# Patient Record
Sex: Female | Born: 1965 | Race: Black or African American | Hispanic: No | Marital: Married | Smoking: Current some day smoker
Health system: Southern US, Community
[De-identification: ages and names within clinical notes are randomized; demographics above are authoritative.]

## PROBLEM LIST (undated history)

## (undated) DIAGNOSIS — F419 Anxiety disorder, unspecified: Secondary | ICD-10-CM

## (undated) DIAGNOSIS — M810 Age-related osteoporosis without current pathological fracture: Secondary | ICD-10-CM

## (undated) DIAGNOSIS — M1990 Unspecified osteoarthritis, unspecified site: Secondary | ICD-10-CM

## (undated) DIAGNOSIS — I10 Essential (primary) hypertension: Secondary | ICD-10-CM

## (undated) DIAGNOSIS — F32A Depression, unspecified: Secondary | ICD-10-CM

## (undated) DIAGNOSIS — E119 Type 2 diabetes mellitus without complications: Secondary | ICD-10-CM

## (undated) DIAGNOSIS — T7840XA Allergy, unspecified, initial encounter: Secondary | ICD-10-CM

## (undated) DIAGNOSIS — G709 Myoneural disorder, unspecified: Secondary | ICD-10-CM

## (undated) DIAGNOSIS — G4730 Sleep apnea, unspecified: Secondary | ICD-10-CM

## (undated) DIAGNOSIS — M8580 Other specified disorders of bone density and structure, unspecified site: Secondary | ICD-10-CM

## (undated) HISTORY — DX: Unspecified osteoarthritis, unspecified site: M19.90

## (undated) HISTORY — DX: Depression, unspecified: F32.A

## (undated) HISTORY — DX: Essential (primary) hypertension: I10

## (undated) HISTORY — DX: Anxiety disorder, unspecified: F41.9

## (undated) HISTORY — DX: Allergy, unspecified, initial encounter: T78.40XA

## (undated) HISTORY — DX: Other specified disorders of bone density and structure, unspecified site: M85.80

## (undated) HISTORY — DX: Type 2 diabetes mellitus without complications: E11.9

## (undated) HISTORY — DX: Age-related osteoporosis without current pathological fracture: M81.0

## (undated) HISTORY — DX: Sleep apnea, unspecified: G47.30

## (undated) HISTORY — DX: Myoneural disorder, unspecified: G70.9

---

## 1997-08-21 ENCOUNTER — Ambulatory Visit (HOSPITAL_COMMUNITY): Admission: RE | Admit: 1997-08-21 | Discharge: 1997-08-21 | Payer: Self-pay | Admitting: Obstetrics and Gynecology

## 1997-09-04 ENCOUNTER — Inpatient Hospital Stay (HOSPITAL_COMMUNITY): Admission: AD | Admit: 1997-09-04 | Discharge: 1997-09-04 | Payer: Self-pay | Admitting: Obstetrics and Gynecology

## 1997-09-05 ENCOUNTER — Inpatient Hospital Stay (HOSPITAL_COMMUNITY): Admission: AD | Admit: 1997-09-05 | Discharge: 1997-09-05 | Payer: Self-pay | Admitting: Obstetrics and Gynecology

## 1997-11-30 ENCOUNTER — Inpatient Hospital Stay: Admission: AD | Admit: 1997-11-30 | Discharge: 1997-11-30 | Payer: Self-pay | Admitting: Obstetrics and Gynecology

## 1997-12-08 ENCOUNTER — Inpatient Hospital Stay (HOSPITAL_COMMUNITY): Admission: AD | Admit: 1997-12-08 | Discharge: 1997-12-10 | Payer: Self-pay | Admitting: Obstetrics and Gynecology

## 1998-01-13 ENCOUNTER — Encounter (HOSPITAL_COMMUNITY): Admission: RE | Admit: 1998-01-13 | Discharge: 1998-04-13 | Payer: Self-pay | Admitting: Obstetrics and Gynecology

## 1999-02-17 ENCOUNTER — Other Ambulatory Visit: Admission: RE | Admit: 1999-02-17 | Discharge: 1999-02-17 | Payer: Self-pay | Admitting: Obstetrics and Gynecology

## 1999-10-15 ENCOUNTER — Encounter: Payer: Self-pay | Admitting: Family Medicine

## 1999-10-15 ENCOUNTER — Ambulatory Visit (HOSPITAL_COMMUNITY): Admission: RE | Admit: 1999-10-15 | Discharge: 1999-10-15 | Payer: Self-pay | Admitting: Family Medicine

## 1999-11-11 ENCOUNTER — Ambulatory Visit (HOSPITAL_COMMUNITY): Admission: RE | Admit: 1999-11-11 | Discharge: 1999-11-11 | Payer: Self-pay | Admitting: Neurosurgery

## 1999-11-11 ENCOUNTER — Encounter: Payer: Self-pay | Admitting: Neurosurgery

## 2000-01-04 ENCOUNTER — Encounter: Payer: Self-pay | Admitting: Neurosurgery

## 2000-01-04 ENCOUNTER — Ambulatory Visit (HOSPITAL_COMMUNITY): Admission: RE | Admit: 2000-01-04 | Discharge: 2000-01-04 | Payer: Self-pay | Admitting: Neurosurgery

## 2000-03-24 ENCOUNTER — Other Ambulatory Visit: Admission: RE | Admit: 2000-03-24 | Discharge: 2000-03-24 | Payer: Self-pay | Admitting: Obstetrics and Gynecology

## 2002-05-10 ENCOUNTER — Encounter: Payer: Self-pay | Admitting: Family Medicine

## 2002-05-10 ENCOUNTER — Ambulatory Visit (HOSPITAL_COMMUNITY): Admission: RE | Admit: 2002-05-10 | Discharge: 2002-05-10 | Payer: Self-pay | Admitting: Family Medicine

## 2002-05-28 ENCOUNTER — Encounter: Admission: RE | Admit: 2002-05-28 | Discharge: 2002-05-28 | Payer: Self-pay

## 2003-06-05 ENCOUNTER — Other Ambulatory Visit: Admission: RE | Admit: 2003-06-05 | Discharge: 2003-06-05 | Payer: Self-pay | Admitting: Obstetrics and Gynecology

## 2005-03-15 ENCOUNTER — Emergency Department (HOSPITAL_COMMUNITY): Admission: EM | Admit: 2005-03-15 | Discharge: 2005-03-15 | Payer: Self-pay | Admitting: *Deleted

## 2006-09-14 ENCOUNTER — Encounter: Admission: RE | Admit: 2006-09-14 | Discharge: 2006-09-14 | Payer: Self-pay | Admitting: Obstetrics and Gynecology

## 2006-09-22 ENCOUNTER — Encounter: Admission: RE | Admit: 2006-09-22 | Discharge: 2006-09-22 | Payer: Self-pay | Admitting: Obstetrics and Gynecology

## 2007-10-24 ENCOUNTER — Encounter: Admission: RE | Admit: 2007-10-24 | Discharge: 2007-10-24 | Payer: Self-pay | Admitting: Obstetrics and Gynecology

## 2007-11-01 ENCOUNTER — Encounter: Admission: RE | Admit: 2007-11-01 | Discharge: 2007-11-01 | Payer: Self-pay | Admitting: Obstetrics and Gynecology

## 2008-11-12 ENCOUNTER — Encounter: Admission: RE | Admit: 2008-11-12 | Discharge: 2008-11-12 | Payer: Self-pay | Admitting: Obstetrics and Gynecology

## 2009-11-13 ENCOUNTER — Encounter: Admission: RE | Admit: 2009-11-13 | Discharge: 2009-11-13 | Payer: Self-pay | Admitting: Obstetrics and Gynecology

## 2010-02-14 ENCOUNTER — Encounter: Payer: Self-pay | Admitting: Obstetrics and Gynecology

## 2010-02-14 ENCOUNTER — Encounter: Payer: Self-pay | Admitting: Family Medicine

## 2010-09-10 ENCOUNTER — Ambulatory Visit (HOSPITAL_COMMUNITY)
Admission: RE | Admit: 2010-09-10 | Discharge: 2010-09-10 | Disposition: A | Discharge status: Home / Self Care | Payer: BC Managed Care – PPO | Source: Ambulatory Visit | Attending: Orthopedic Surgery | Admitting: Orthopedic Surgery

## 2010-09-10 ENCOUNTER — Other Ambulatory Visit (HOSPITAL_COMMUNITY): Payer: Self-pay | Admitting: Orthopedic Surgery

## 2010-09-10 ENCOUNTER — Encounter (HOSPITAL_COMMUNITY)
Admission: RE | Admit: 2010-09-10 | Discharge: 2010-09-10 | Disposition: A | Discharge status: Home / Self Care | Payer: BC Managed Care – PPO | Source: Ambulatory Visit | Attending: Orthopedic Surgery | Admitting: Orthopedic Surgery

## 2010-09-10 DIAGNOSIS — Z01811 Encounter for preprocedural respiratory examination: Secondary | ICD-10-CM

## 2010-09-10 DIAGNOSIS — Z01812 Encounter for preprocedural laboratory examination: Secondary | ICD-10-CM | POA: Insufficient documentation

## 2010-09-10 DIAGNOSIS — Z01818 Encounter for other preprocedural examination: Secondary | ICD-10-CM | POA: Insufficient documentation

## 2010-09-10 LAB — COMPREHENSIVE METABOLIC PANEL
AST: 24 U/L (ref 0–37)
Albumin: 4.1 g/dL (ref 3.5–5.2)
Calcium: 9.9 mg/dL (ref 8.4–10.5)
Creatinine, Ser: 0.81 mg/dL (ref 0.50–1.10)
GFR calc Af Amer: >60 mL/min (ref >60)
Glucose, Bld: 99 mg/dL (ref 70–99)
Sodium: 138 mEq/L (ref 135–145)
Total Bilirubin: 0.3 mg/dL (ref 0.3–1.2)
Total Protein: 7.1 g/dL (ref 6.0–8.3)

## 2010-09-10 LAB — URINALYSIS, ROUTINE W REFLEX MICROSCOPIC
Bilirubin Urine: NEGATIVE
Glucose, UA: NEGATIVE mg/dL
Ketones, ur: NEGATIVE mg/dL
Protein, ur: NEGATIVE mg/dL
Specific Gravity, Urine: 1.022 (ref 1.005–1.030)
Urobilinogen, UA: 0.2 mg/dL (ref 0.0–1.0)
pH: 5 (ref 5.0–8.0)

## 2010-09-10 LAB — DIFFERENTIAL
Eosinophils Absolute: 0.1 10*3/uL (ref 0.0–0.7)
Lymphs Abs: 3.7 10*3/uL (ref 0.7–4.0)
Monocytes Relative: 8 % (ref 3–12)

## 2010-09-10 LAB — CBC
HCT: 46.2 % — ABNORMAL HIGH (ref 36.0–46.0)
MCHC: 34.2 g/dL (ref 30.0–36.0)
RBC: 5.32 MIL/uL — ABNORMAL HIGH (ref 3.87–5.11)

## 2010-09-10 LAB — TYPE AND SCREEN: ABO/RH(D): O POS

## 2010-09-10 LAB — PROTIME-INR
INR: 0.95 (ref 0.00–1.49)
Prothrombin Time: 12.9 seconds (ref 11.6–15.2)

## 2010-09-10 LAB — SURGICAL PCR SCREEN: Staphylococcus aureus: NEGATIVE

## 2010-09-10 LAB — APTT: aPTT: 34 seconds (ref 24–37)

## 2010-09-16 ENCOUNTER — Observation Stay (HOSPITAL_COMMUNITY)
Admission: RE | Admit: 2010-09-16 | Discharge: 2010-09-17 | DRG: 864 | Disposition: A | Discharge status: Home / Self Care | Payer: BC Managed Care – PPO | Source: Ambulatory Visit | Attending: Orthopedic Surgery | Admitting: Orthopedic Surgery

## 2010-09-16 ENCOUNTER — Ambulatory Visit (HOSPITAL_COMMUNITY): Payer: BC Managed Care – PPO

## 2010-09-16 DIAGNOSIS — Z01818 Encounter for other preprocedural examination: Secondary | ICD-10-CM | POA: Insufficient documentation

## 2010-09-16 DIAGNOSIS — M5000 Cervical disc disorder with myelopathy, unspecified cervical region: Principal | ICD-10-CM | POA: Insufficient documentation

## 2010-09-16 DIAGNOSIS — F17200 Nicotine dependence, unspecified, uncomplicated: Secondary | ICD-10-CM | POA: Insufficient documentation

## 2010-09-16 DIAGNOSIS — Y998 Other external cause status: Secondary | ICD-10-CM | POA: Insufficient documentation

## 2010-09-16 DIAGNOSIS — J449 Chronic obstructive pulmonary disease, unspecified: Secondary | ICD-10-CM | POA: Insufficient documentation

## 2010-09-16 DIAGNOSIS — J4489 Other specified chronic obstructive pulmonary disease: Secondary | ICD-10-CM | POA: Insufficient documentation

## 2010-09-16 DIAGNOSIS — Z01812 Encounter for preprocedural laboratory examination: Secondary | ICD-10-CM | POA: Insufficient documentation

## 2010-09-16 DIAGNOSIS — Z01810 Encounter for preprocedural cardiovascular examination: Secondary | ICD-10-CM | POA: Insufficient documentation

## 2010-09-16 DIAGNOSIS — S01502A Unspecified open wound of oral cavity, initial encounter: Secondary | ICD-10-CM | POA: Insufficient documentation

## 2010-09-16 DIAGNOSIS — W503XXA Accidental bite by another person, initial encounter: Secondary | ICD-10-CM | POA: Insufficient documentation

## 2010-09-16 HISTORY — PX: SPINAL CORD DECOMPRESSION: SHX97

## 2010-09-16 HISTORY — PX: SPINAL FUSION: SHX223

## 2010-09-22 NOTE — Consult Note (Signed)
  [name], CELLUCCI NO.:  [id number]  MEDICAL RECORD NO.:  [id number]  LOCATION:  3536                         FACILITY:  [facility]  PHYSICIAN:  Newman Pies, MD            DATE OF BIRTH:  [birth year]  DATE OF CONSULTATION:  09/16/2010 DATE OF DISCHARGE:                                CONSULTATION   CHIEF COMPLAINT:  Dorsal tongue laceration.  HISTORY OF PRESENT ILLNESS:  The patient is a pleasant 45 year old female who underwent anterior cervical spine decompression and fusion procedure by Dr. Yevette Edwards earlier today.  Upon waking up from her procedure, the patient accidentally bite down on her tongue causing a significant laceration of the dorsal tongue.  A significant amount of bleeding was noted initially.  However, the bleeding has spontaneously resolved.  ENT is consulted for further evaluation and treatment.  PAST MEDICAL HISTORY: 1. History of right arm myelopathy. 2. 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 PAST SURGICAL HISTORY:  As above.  HOME MEDICATION:  Multivitamin, Vicodin, meloxicam, cyclobenzaprine.  ALLERGIES:  PENICILLIN.  SOCIAL HISTORY:  The patient smokes 1-2 pack of cigarettes per week. She denies any significant alcohol or illegal drug abuse.  PHYSICAL EXAMINATION:  GENERAL:  The patient is a well-nourished and well-developed 45 year old female in no acute distress.  She is alert and oriented x3. HEENT:  Her pupils are equal, round, and reactive to light.  Extraocular motion is intact.  Examination of the ears shows normal auricles and external auditory canals.  Nasal examination shows normal mucosa, septum, and turbinates.  Oral cavity examination shows a large 2-cm left dorsal tongue laceration.  The laceration is not through-and-through. There is a good amount of soft tissue support on the lateral aspect as well as the ventral aspect of the tongue.  No acute bleeding is noted at the time of examination.  The rest of the oral cavity,  mucosa, and pharynx are all normal.  The patient is in cervical collar.  No stridor is noted.  Her voice is strong.  Cranial nerves II through XII are intact.  Tongue movement is normal.  IMPRESSION:  Accidental tongue lacerations upon emergence from her general anesthesia.  RECOMMENDATIONS:  Since the laceration is not through-and-through, and the patient has good tongue tissue to support the lateral aspect as well as the ventral aspect, no suturing is needed.  The patient will likely heal without any long-term sequelae.  The patient is instructed to call my office with any questions or concerns.  She may reach my office at [phone].     Newman Pies, MD     ST/MEDQ  D:  09/16/2010  T:  09/17/2010  Job:  [id number]  Electronically Signed by Newman Pies MD on 09/22/2010 12:22:07 PM

## 2010-09-28 NOTE — Discharge Summary (Signed)
  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  ADMITTING PHYSICIAN:  Estill Bamberg, MD  ADMISSION HISTORY:  Briefly, [name] is an extremely pleasant 45 year old female who was evaluated by me and diagnosed with myelopathy in addition to radiculopathy.  The patient was admitted on September 16, 2010, for a three-level anterior cervical decompression and fusion.  HOSPITAL COURSE:  On September 16, 2010, the patient was admitted to the hospital, underwent a three-level ACDF as noted above.  The patient tolerated the procedure well and was transferred to recovery in stable condition.  Of note, was identified by the Anesthesia Team that the patient sustained a tongue laceration involving the anterior and left- sided aspect of her tongue.  This did go on cause her some discomfort. I did consult an ENT specialist.  She was evaluated by ENT on the evening of her surgery at 6:00 p.m.Marland Kitchen  The ENT specialist did not feel that any specific treatment was needed and that this would heal uneventfully on its own.  The patient's tongue pain did decrease throughout her hospital stay.  She was evaluated by me, in the morning of postoperative day #1 was noted to be neurovascular intact and her dressing was noted be clean, dry, and intact.  We therefore discharged her home uneventfully.  DISCHARGE INSTRUCTIONS:  The patient will wear her [place] collar at all times.  She was given a [place] collar to be used while  showering. She was given Percocet for pain and Valium for spasms.  She will follow back up with me in approximately 2 weeks after her procedure.     Estill Bamberg, MD     MD/MEDQ  D:  09/17/2010  T:  09/17/2010  Job:  [id number]  Electronically Signed by Estill Bamberg  on 09/28/2010 06:46:10 PM

## 2010-09-28 NOTE — Op Note (Signed)
[name], [name] NO.:  [id number]  MEDICAL RECORD NO.:  [id number]  LOCATION:  SDSC                         FACILITY:  [facility]  PHYSICIAN:  Estill Bamberg, MD      DATE OF BIRTH:  [birth year]  DATE OF PROCEDURE:  09/16/2010 DATE OF DISCHARGE:                              OPERATIVE REPORT   PREOPERATIVE DIAGNOSIS:  Right-sided cervical myeloradiculopathy.  POSTOPERATIVE DIAGNOSIS:  Right-sided cervical myeloradiculopathy.  PROCEDURE: 1. C4-5, C5-6, C6-7 anterior cervical decompression and fusion. 2. Anterior instrumentation C4-C7 (Synthes Vectra plate with 16 mm     self-drilling, self-tapping screws). 3. Insertion of interbody device x3 (a 6-mm Titan interbody cage). 4. Use of local autograft. 5. Intraoperative use of fluoroscopy.  SURGEON:  Estill Bamberg, MD  ASSISTANT:  Janace Litten, OPA  ANESTHESIA:  General endotracheal anesthesia.  COMPLICATIONS:  None.  DISPOSITION:  Stable.  ESTIMATED BLOOD LOSS:  Minimal.  INDICATIONS FOR PROCEDURE:  Briefly, [name] is an extremely pleasant 45 year old female who initially presented to me on August 09, 2010, with complaints related to both her neck and her low back as well as her right arm and her right leg.  The patient was noted to have significant degeneration of her cervical spine.  She also had history and physical exam findings consistent with cervical myelopathy.  I therefore did go forward obtaining an MRI which is clearly notable for varying degrees of neural foraminal stenosis in addition to spinal cord compression as well as advanced in severe degenerative disk disease at the C4-5, C5-6 and C6- 7 levels.  After her failure of conservative care, we did discuss going ahead with a C4-C7 anterior cervical decompression and fusion.  Of note, the patient clearly understood the risks and limitations of the procedure as outlined in my preoperative note.  OPERATIVE DETAILS:  On September 16, 2010, the  patient was brought to Surgery and general endotracheal anesthesia was administered.  The neck was placed in a gentle degree of extension and the shoulders were taped to the inferior aspect of the bed.  Of note, neurologic monitoring was used throughout the procedure to ensure that there was no occult spinal cord injury.  There was no change in the baseline motor evoked potentials throughout the surgery.  SCDs were placed.  1 gram of vancomycin was given.  Bite blocks were placed by the anesthesia team in the standard fashion and did appear to be placed adequately.  I did at this point  bring in lateral fluoroscopy in order to help optimize the location of my incision.  The neck was then prepped and draped in the usual sterile fashion.  I then made an incision from the midline to the medial border of the sternocleidomastoid muscle overlying the C5-6 interspace.  This was made in line with a skin crease.  The platysma was identified and taken down.  The plane between the sternocleidomastoid muscle laterally and the strap muscles medially was identified and explored and the anterior cervical spine was readily identified.  Of note, there were extensive and rather prominent osteophytes noted across the C4-5, C5-6 and C6-7 levels.  I did place an 18-gauge needle into the C5-6 interspace to help  confirm the appropriate levels.  I then removed the rather significant osteophytes at the C4-5, C5-6 and C6-7 levels.  The osteophytes were placed in the back table for later use.  I then subperiosteally exposed the vertebral bodies of C4, C5, C6 and C7 out to the uncovertebral joints bilaterally.  At this point, a self-retaining shadow line retractor was placed and centered over the C6-7 interspace.  I then used a 15-blade knife to perform an anterior annulotomy.  I used a series of Kerrisons and curettes in order to perform a preliminary diskectomy.  Caspar pins were placed into the C6 and C7  vertebral bodies and gentle distraction was applied.  The posterior longitudinal ligament was encountered and taken down using a #1 Kerrison.  I then meticulously used a #1 Kerrison to remove the disk osteophyte complex more towards the patient's left side, the area where there was rather significant spinal cord compression noted on her preoperative MRI.  I did go forward with a thorough neural foraminal decompression on both the right and the left sides at the C6-7 level.  I then gently prepare the endplates using a rasp.  I then placed a series of trials and I did feel that a parallel 6-mm Titan cage would be the most appropriate fit.  The Titan cage was packed with the autograft obtained when the bone spurs were removed in addition to DBX obtained from the [facility] [facility] [facility].  The interbody graft was tamped into position in the usual fashion.  I did note an excellent press fit.  I then turned my attention towards the C5-6 interspace.  A diskectomy was performed in the manner described previously.  Again, a thorough decompression was performed posteriorly out to the right and left neural foramina.  I again felt that a 6-mm graft would be the most appropriate fit.  A lordotic graft was selected and packed with DBX in addition to autograft.  This again was tamped into position in the usual fashion.  This was done under distraction using Caspar pins.  I then turned my attention towards the C4-5 interspace.  Again, a diskectomy was performed as previously described.  A 6-mm lordotic graft was tamped into position in the usual fashion.  I was very happy with the final press fit of each of the interbody grafts.  I did use intermittent AP and lateral fluoroscopy in order to confirm optimize location of the interbody grafts.  I then selected a Synthes Vectra plate of the appropriate length and this was placed over the anterior aspect of the cervical spine.  I then placed a  total of eight 16 mm self-drilling, self-tapping screws, two into C4, two into C5, two into C6 and two into C7.  I did note excellent purchase of each of the screws.  I again used the AP and lateral fluoroscopy while placing the screws in order to help optimize the trajectory of the screws.  The left-sided C5 screw had a slightly inferior trajectory and I did attempt to repositioned the screw, but was unsuccessful in attempting me to create more cephalad trajectory.  However, I did note an excellent press fit.  I was very happy with the final AP and lateral fluoroscopic radiographs.  I then copiously irrigated the wound.  The platysma and subcutaneous layers were closed using 2-0 Vicryl.  The skin was closed using 3-0 Monocryl. Steri-Strips and sterile dressing was applied followed by a hard cervical collar.  The patient was then awoken from general endotracheal  anesthesia and transferred to recovery in stable condition.  All instrument counts were correct at the termination of the procedure.  Of note, Janace Litten was my assistant throughout the procedure and aided in essential retraction and suctioning needed throughout the surgery.  Also of note, the  patient was noted to have a tongue laceration at the  anterior and left aspect of the tongue upon completion of the surgery and after removal of the patient's bite blocks.  I did discuss this with the  anesthesia team and there was no readily available explanation as to why this  did occur.  I did explain this finding to the patient's family and will evaluate  the patient later this evening and consult an ENT specialist if warrented.  I will also continue to investigate why this may have occured as it did appear  the bite blocks were appropriately positioned.     Estill Bamberg, MD     MD/MEDQ  D:  09/16/2010  T:  09/16/2010  Job:  [id number]  cc:   Izell [place]. Elesa Hacker, M.D.  Electronically Signed by Estill Bamberg  on 09/28/2010  06:56:43 PM

## 2011-01-05 ENCOUNTER — Ambulatory Visit (INDEPENDENT_AMBULATORY_CARE_PROVIDER_SITE_OTHER): Payer: BC Managed Care – PPO

## 2011-01-05 DIAGNOSIS — J209 Acute bronchitis, unspecified: Secondary | ICD-10-CM

## 2011-01-05 DIAGNOSIS — R05 Cough: Secondary | ICD-10-CM

## 2011-01-05 DIAGNOSIS — R059 Cough, unspecified: Secondary | ICD-10-CM

## 2011-06-27 ENCOUNTER — Ambulatory Visit (INDEPENDENT_AMBULATORY_CARE_PROVIDER_SITE_OTHER): Payer: BC Managed Care – PPO | Admitting: Family Medicine

## 2011-06-27 VITALS — BP 119/80 | HR 68 | Temp 98.4°F | Resp 16 | Ht 65.0 in | Wt 189.0 lb

## 2011-06-27 DIAGNOSIS — M792 Neuralgia and neuritis, unspecified: Secondary | ICD-10-CM

## 2011-06-27 DIAGNOSIS — M62838 Other muscle spasm: Secondary | ICD-10-CM

## 2011-06-27 DIAGNOSIS — IMO0002 Reserved for concepts with insufficient information to code with codable children: Secondary | ICD-10-CM

## 2011-06-27 MED ORDER — HYDROCODONE-ACETAMINOPHEN 5-500 MG PO TABS: 1.0000 | ORAL_TABLET | Freq: Two times a day (BID) | ORAL | Status: AC | PRN

## 2011-06-27 MED ORDER — CYCLOBENZAPRINE HCL 5 MG PO TABS: 5.0000 mg | ORAL_TABLET | Freq: Three times a day (TID) | ORAL | Status: DC | PRN

## 2011-06-27 MED ORDER — CYCLOBENZAPRINE HCL 5 MG PO TABS: 5.0000 mg | ORAL_TABLET | Freq: Three times a day (TID) | ORAL | Status: AC | PRN

## 2011-06-27 NOTE — Patient Instructions (Signed)
Patient information: Muscle strain (The Basics)View in SpanishWritten by the doctors and Visual merchandiser at [employer]  What is a muscle strain? -- A muscle strain can happen when a muscle gets stretched too much or too quickly, or works too hard. This sometimes makes the muscle tear. Another term for a muscle strain is a "pulled muscle." A muscle strain can happen during an accident or exercise. Muscles that are commonly strained include those in the back, neck, and back of the leg. What are the symptoms of a muscle strain? -- Symptoms happen in the area of the muscle strain and can include: Pain  Muscle spasm or tightness  Swelling  Bruising  Weakness or being unable to move the muscle Will I need tests? -- Probably not. Your doctor or nurse should be able to tell if you have a muscle strain by learning about your symptoms and doing an exam.  Some people need tests. Depending on your symptoms, your doctor or nurse might order an imaging test such as an ultrasound or MRI scan. Imaging tests create pictures of the inside of the body. How is a muscle strain treated? -- A muscle strain usually gets better on its own, but it can take days to weeks to heal completely.  To help your symptoms get better, you can: Rest your muscle and avoid movements or activities that cause pain  Ice the area - You can put a cold gel pack, bag of ice, or bag of frozen vegetables on the painful muscle every 1 to 2 hours, for 15 minutes each time. Put a thin towel between the ice (or other cold object) and your skin. Use the ice (or other cold object) for at least 6 hours after the injury. Some people find it helpful to ice up to 2 days after an injury.  Wrap your muscle with an elastic bandage, other type of wrap, or fabric "sleeve" (Show Thigh sleeve PI) - This helps support your muscle.  Raise the muscle above the level of your heart (if possible) - For example, you can prop your leg up on pillows. This is helpful only for the  first few days after an injury.  Take medicine to reduce the pain and swelling - If you have a lot of pain or a severe muscle strain, your doctor will prescribe a strong pain medicine. If your strain is not severe, you can take an over-the-counter medicine such as acetaminophen (sample brand name: Tylenol), ibuprofen (sample brand names: Advil, Motrin), or naproxen (sample brand names: Aleve, Naprosyn). After your pain gets better, your doctor or nurse will recommend that you gently stretch and exercise your muscle. Stretches and exercises can help strengthen your muscles and keep them from getting too stiff.  Your doctor or nurse will show you stretches and exercises to do. Or he or she will have you work with a physical therapist (exercise expert). It's important to let your muscle heal before you play sports or do other activities that use the muscle again. If you don't let your muscle heal, you are likely to injure it again. Can a muscle strain be prevented? -- You can help prevent a muscle strain by taking time to warm up your muscles before you exercise. You can do this by walking or doing another light activity. If you are not sure how to warm up before exercising, ask your doctor, nurse, or physical therapist. More on this topic

## 2011-06-27 NOTE — Progress Notes (Signed)
  Subjective:    Patient ID: [name], female    DOB: 11-Jan-1966, 46 y.o.   MRN: [id number]  HPI Neck and arm spasms x 1 week.  Prior hx/o cervical laminectomy with [facility] 08/2010 via Dr. Marissa Nestle  Has had steroid shots for this in the past.  Has had some recurrent upper and lower R extremity paresthesias and radicular pain.  No weakness or giving away/fallls  Works at [employer] and has noticed worsening pain with lateral movement of neck.  Some radicular pain down R arm, though predominant issue has been neck and shoulder pain.  Had episode where pt had to stop working because of neck and shoulder pain/tightness.     Review of Systems See HPI, otherwise ROS negative     Objective:   Physical Exam  Musculoskeletal:       Arms:  Gen: up in chair, NAD HEENT: NCAT, EOMI,  CV: RRR, no murmurs auscultated PULM: CTAB, no wheezes, rales, rhoncii ABD: S/NT/+ bowel sounds  EXT: 2+ peripheral pulses Neuro: CN II-XII grossly intact, no focal paresthesias, reflexes symmetric bilaterally, neurovascularly intact diffusely    Assessment & Plan:  Will treat for muscle spasm with vicodin and flexeril.  Likely neuropathic component given recurrent radicular sxs.  Will refer back to Dr. Marissa Nestle. May consider starting on neurontin for long term symptomatic.

## 2011-08-23 ENCOUNTER — Other Ambulatory Visit: Payer: Self-pay | Admitting: Obstetrics and Gynecology

## 2011-08-23 DIAGNOSIS — Z1231 Encounter for screening mammogram for malignant neoplasm of breast: Secondary | ICD-10-CM

## 2011-08-31 ENCOUNTER — Ambulatory Visit
Admission: RE | Admit: 2011-08-31 | Discharge: 2011-08-31 | Disposition: A | Discharge status: Home / Self Care | Payer: BC Managed Care – PPO | Source: Ambulatory Visit | Attending: Obstetrics and Gynecology | Admitting: Obstetrics and Gynecology

## 2011-08-31 DIAGNOSIS — Z1231 Encounter for screening mammogram for malignant neoplasm of breast: Secondary | ICD-10-CM

## 2012-02-27 ENCOUNTER — Ambulatory Visit (INDEPENDENT_AMBULATORY_CARE_PROVIDER_SITE_OTHER): Payer: BC Managed Care – PPO | Admitting: Emergency Medicine

## 2012-02-27 ENCOUNTER — Ambulatory Visit: Payer: BC Managed Care – PPO

## 2012-02-27 ENCOUNTER — Telehealth: Payer: Self-pay | Admitting: Radiology

## 2012-02-27 VITALS — BP 118/78 | HR 71 | Temp 97.9°F | Resp 16 | Ht 65.5 in | Wt 187.4 lb

## 2012-02-27 DIAGNOSIS — R55 Syncope and collapse: Secondary | ICD-10-CM

## 2012-02-27 DIAGNOSIS — Z981 Arthrodesis status: Secondary | ICD-10-CM

## 2012-02-27 DIAGNOSIS — F17200 Nicotine dependence, unspecified, uncomplicated: Secondary | ICD-10-CM

## 2012-02-27 LAB — POCT CBC
HCT, POC: 48.9 % — AB (ref 37.7–47.9)
Lymph, poc: 3.3 (ref 0.6–3.4)
MCHC: 31.9 g/dL (ref 31.8–35.4)
MCV: 91.8 fL (ref 80–97)
POC Granulocyte: 4.3 (ref 2–6.9)
POC LYMPH PERCENT: 40.2 %L (ref 10–50)
RDW, POC: 14.8 %

## 2012-02-27 LAB — COMPREHENSIVE METABOLIC PANEL
CO2: 27 mEq/L (ref 19–32)
Creat: 0.96 mg/dL (ref 0.50–1.10)
Glucose, Bld: 91 mg/dL (ref 70–99)
Total Bilirubin: 0.5 mg/dL (ref 0.3–1.2)

## 2012-02-27 LAB — TSH: TSH: 1.199 u[IU]/mL (ref 0.350–4.500)

## 2012-02-27 LAB — T4, FREE: Free T4: 1.17 ng/dL (ref 0.80–1.80)

## 2012-02-27 NOTE — Progress Notes (Signed)
  Subjective:    Patient ID: [name], female    DOB: 12/07/1965, 47 y.o.   MRN: [id number]  HPI  Patient presents with concern after passing out on Saturday evening while out of town. Prior to passing out she saw blinking lights, she went outside to get some air. Pt was sitting in a chair and rocking. When she went to get up with the assistance of her husband because she was feeling very weak. She fell limp unable to walk. Once she was in the house her family put a cold rag on her neck, she then passed out and complained that she was unable to see. Husband states that she was shaking on her upper half, she did not have any strength or motion in her lower limbs. She did not lose control of her bowels during that time. She reports that she has never had anything happen to her like this before. This episode lasted about 15 minutes. She has no chest pain palpitations or shortness of breath   Pt has a history of smoking. A pack might last her 2-3 days. She does not drink alcohol on a regular basis. Pt had a spinal fusion last year.   Pt comes here for care when needed. Does not report a PCP.   Review of Systems     Objective:   Physical Exam HEENT exam reveals a irregular nodule in the sub mental area. The tongue itself looks normal. There is no other adenopathy in the neck normal carotids are clear. Chest clear to auscultation and percussion heart regular rate no murmurs neurological exam 2 through 12 is intact. Disc margins are sharp. Deep tendon reflexes are symmetrical. Motor strength is 5 out of 5.  [facility] reading (PRIMARY) by  Dr.Clarrisa Kaylor no acute changes. Status post neck fusion. 1       Assessment & Plan:  Patient here with a syncopal episode on Saturday night very suspicious for seizure. The event was preceded by blinking lights she saw in both eyes. This was followed by loss of consciousness and some shaking of the upper extremities. Routine blood work will be done. EKG done. We'll  schedule an MRI with contrast of the brain neurological referral for possible seizure disorder

## 2012-02-27 NOTE — Telephone Encounter (Signed)
Referrals FYI: Please make the MRI brain with contrast appointment by Wednesday.  Thanks:)

## 2012-02-28 ENCOUNTER — Other Ambulatory Visit: Payer: Self-pay | Admitting: Radiology

## 2012-02-28 DIAGNOSIS — R55 Syncope and collapse: Secondary | ICD-10-CM

## 2012-02-29 ENCOUNTER — Other Ambulatory Visit: Payer: Self-pay | Admitting: Otolaryngology

## 2012-02-29 DIAGNOSIS — R221 Localized swelling, mass and lump, neck: Secondary | ICD-10-CM

## 2012-03-02 ENCOUNTER — Ambulatory Visit
Admission: RE | Admit: 2012-03-02 | Discharge: 2012-03-02 | Disposition: A | Discharge status: Home / Self Care | Payer: BC Managed Care – PPO | Source: Ambulatory Visit | Attending: Otolaryngology | Admitting: Otolaryngology

## 2012-03-02 DIAGNOSIS — R221 Localized swelling, mass and lump, neck: Secondary | ICD-10-CM

## 2012-03-03 ENCOUNTER — Ambulatory Visit
Admission: RE | Admit: 2012-03-03 | Discharge: 2012-03-03 | Disposition: A | Discharge status: Home / Self Care | Payer: BC Managed Care – PPO | Source: Ambulatory Visit | Attending: Emergency Medicine | Admitting: Emergency Medicine

## 2012-03-03 DIAGNOSIS — R55 Syncope and collapse: Secondary | ICD-10-CM

## 2012-03-03 MED ORDER — GADOBENATE DIMEGLUMINE 529 MG/ML IV SOLN
17.0000 mL | Freq: Once | INTRAVENOUS | Status: AC | PRN
  Administered 2012-03-03: 17 mL via INTRAVENOUS

## 2012-03-05 ENCOUNTER — Other Ambulatory Visit: Payer: Self-pay | Admitting: Emergency Medicine

## 2012-03-05 DIAGNOSIS — R55 Syncope and collapse: Secondary | ICD-10-CM

## 2012-03-07 ENCOUNTER — Telehealth: Payer: Self-pay

## 2012-03-07 NOTE — Telephone Encounter (Signed)
Pt said that someone called her but her husband answered and didn't tell her who called but would like for someone to call her back

## 2012-03-07 NOTE — Telephone Encounter (Signed)
I spoke to patient to advise on the Scan she did see Dr Terrace Arabia at [facility]rology.  She said Dr Terrace Arabia gave her meds for depression and sleep. She does not feel she needs these medications and does not plan to take these. I did advise her the medication for depression could also be used to treat neurologic conditions, she should research this further before she decides not to take this. She did not know the name of the medication. She states she will call Dr Terrace Arabia to get clarification regarding her medications. I did tell her the MRI was negative, but she may require further testing, and Dr Terrace Arabia is the one who would do this. Amy

## 2012-08-07 ENCOUNTER — Other Ambulatory Visit: Payer: Self-pay

## 2012-08-07 DIAGNOSIS — Z1231 Encounter for screening mammogram for malignant neoplasm of breast: Secondary | ICD-10-CM

## 2012-08-31 ENCOUNTER — Ambulatory Visit: Payer: BC Managed Care – PPO

## 2012-09-11 ENCOUNTER — Ambulatory Visit
Admission: RE | Admit: 2012-09-11 | Discharge: 2012-09-11 | Disposition: A | Discharge status: Home / Self Care | Payer: BC Managed Care – PPO | Source: Ambulatory Visit

## 2012-09-11 DIAGNOSIS — Z1231 Encounter for screening mammogram for malignant neoplasm of breast: Secondary | ICD-10-CM

## 2013-06-07 ENCOUNTER — Ambulatory Visit (INDEPENDENT_AMBULATORY_CARE_PROVIDER_SITE_OTHER): Payer: BC Managed Care – PPO | Admitting: Internal Medicine

## 2013-06-07 VITALS — BP 120/80 | HR 64 | Temp 98.3°F | Resp 16 | Ht 64.5 in | Wt 193.0 lb

## 2013-06-07 DIAGNOSIS — R599 Enlarged lymph nodes, unspecified: Secondary | ICD-10-CM

## 2013-06-07 MED ORDER — CEPHALEXIN 500 MG PO CAPS: 500.0000 mg | ORAL_CAPSULE | Freq: Three times a day (TID) | ORAL | Status: DC

## 2013-06-07 NOTE — Progress Notes (Signed)
   Subjective:    Patient ID: [name], female    DOB: 22-Jan-1966, 48 y.o.   MRN: [id number] This chart was scribed for Ellamae Siaobert Kinser Fellman, MD by Danella Maiersaroline Early, ED Scribe. This patient was seen in room 5 and the patient's care was started at 5:14 PM.  Chief Complaint  Patient presents with  . Adenopathy    X Wednesday    HPI HPI Comments: [name] is a 48 y.o. female [facility] gradually-worsening left-sided neck pain with associated swelling onset 2 days ago. She has tried home remedies with no relief. She states neck pain worsens with swallowing. She denies throat pain or fevers. She denies pain with biting down.   PCP - LITTLE, ALFRED B, MD  Patient Active Problem List   Diagnosis Date Noted  . S/P cervical spinal fusion 02/27/2012   No current outpatient prescriptions on file prior to visit.   No current facility-administered medications on file prior to visit.     Review of Systems  Constitutional: Negative for fever and chills.  HENT: Negative for sore throat.   Gastrointestinal: Negative for vomiting.  Musculoskeletal: Positive for neck pain.  Skin: Negative for rash.       Objective:   Physical Exam  Nursing note and vitals reviewed. Constitutional: She is oriented to person, place, and time. She appears well-developed and well-nourished. No distress.  HENT:  Head: Normocephalic and atraumatic.  Nose: Nose normal.  Mouth/Throat: Oropharynx is clear and moist. No oropharyngeal exudate.  Throat is fairly clear. Teeth are fine.   Eyes: EOM are normal.  Neck: Normal range of motion. Neck supple. No thyromegaly present.  Tender cervical adenopathy bilaterally.   Cardiovascular: Normal rate.   Pulmonary/Chest: Effort normal. No respiratory distress.  Musculoskeletal: Normal range of motion.  Neurological: She is alert and oriented to person, place, and time.  Skin: Skin is warm and dry.  Psychiatric: She has a normal mood  and affect. Her behavior is normal.    Filed Vitals:   06/07/13 1643  BP: 120/80  Pulse: 64  Temp: 98.3 F (36.8 C)  TempSrc: Oral  Resp: 16  Height: 5' 4.5" (1.638 m)  Weight: 193 lb (87.544 kg)  SpO2: 98%         Assessment & Plan:    I have completed the patient encounter in its entirety as documented by the scribe, with editing by me where necessary. Emile Ringgenberg P. Merla Richesoolittle, M.D. Enlargement of lymph nodes  Meds ordered this encounter  Medications  . cephALEXin (KEFLEX) 500 MG capsule    Sig: Take 1 capsule (500 mg total) by mouth 3 (three) times daily.    Dispense:  30 capsule    Refill:  0   Reck 10d

## 2013-07-18 ENCOUNTER — Ambulatory Visit (INDEPENDENT_AMBULATORY_CARE_PROVIDER_SITE_OTHER): Payer: BC Managed Care – PPO | Admitting: Family Medicine

## 2013-07-18 ENCOUNTER — Encounter: Payer: Self-pay | Admitting: Family Medicine

## 2013-07-18 VITALS — BP 111/78 | HR 85 | Temp 98.4°F | Resp 18 | Ht 65.0 in | Wt 190.0 lb

## 2013-07-18 DIAGNOSIS — F17200 Nicotine dependence, unspecified, uncomplicated: Secondary | ICD-10-CM

## 2013-07-18 DIAGNOSIS — Z0000 Encounter for general adult medical examination without abnormal findings: Secondary | ICD-10-CM

## 2013-07-18 DIAGNOSIS — J302 Other seasonal allergic rhinitis: Secondary | ICD-10-CM

## 2013-07-18 DIAGNOSIS — J309 Allergic rhinitis, unspecified: Secondary | ICD-10-CM

## 2013-07-18 DIAGNOSIS — E669 Obesity, unspecified: Secondary | ICD-10-CM

## 2013-07-18 DIAGNOSIS — Z720 Tobacco use: Secondary | ICD-10-CM | POA: Insufficient documentation

## 2013-07-18 DIAGNOSIS — Z1159 Encounter for screening for other viral diseases: Secondary | ICD-10-CM

## 2013-07-18 DIAGNOSIS — Z13220 Encounter for screening for lipoid disorders: Secondary | ICD-10-CM

## 2013-07-18 MED ORDER — BUPROPION HCL ER (XL) 150 MG PO TB24: 150.0000 mg | ORAL_TABLET | Freq: Every day | ORAL | Status: DC

## 2013-07-18 NOTE — Patient Instructions (Addendum)
Keeping You Healthy  Get These Tests 1. Blood Pressure- Have your blood pressure checked once a year by your health care provider.  Normal blood pressure is 120/80. 2. Weight- Have your body mass index (BMI) calculated to screen for obesity.  BMI is measure of body fat based on height and weight.  You can also calculate your own BMI at https://www.west-esparza.com/www.nhlbisupport.com/bmi/. 3. Cholesterol- Have your cholesterol checked every 5 years starting at age 49 then yearly starting at age 49. 4. Chlamydia, HIV, and other sexually transmitted diseases- Get screened every year until age 49, then within three months of each new sexual provider. 5. Pap Smear- Every 1-3 years; discuss with your health care provider. 6. Mammogram- Every year starting at age 49  Take these medicines  Calcium with Vitamin D-Your body needs 1200 mg of Calcium each day and [phone] IU of Vitamin D daily.  Your body can only absorb 500 mg of Calcium at a time so Calcium must be taken in 2 or 3 divided doses throughout the day.  Multivitamin with folic acid- Once daily if it is possible for you to become pregnant.  Get these Immunizations  Gardasil-Series of three doses; prevents HPV related illness such as genital warts and cervical cancer.  Menactra-Single dose; prevents meningitis.  Tetanus shot- Every 10 years.  Flu shot-Every year.  Take these steps 1. Do not smoke-Your healthcare provider can help you quit.  For tips on how to quit go to www.smokefree.gov or call 1-800 QUITNOW. 2. Be physically active- Exercise 5 days a week for at least 30 minutes.  If you are not already physically active, start slow and gradually work up to 30 minutes of moderate physical activity.  Examples of moderate activity include walking briskly, dancing, swimming, bicycling, etc. 3. Breast Cancer- A self breast exam every month is important for early detection of breast cancer.  For more information and instruction on self breast exams, ask your  healthcare provider or [place].eswww.womenshealth.gov/faq/breast-self-exam.cfm. 4. Eat a healthy diet- Eat a variety of healthy foods such as fruits, vegetables, whole grains, low fat milk, low fat cheeses, yogurt, lean meats, poultry and fish, beans, nuts, tofu, etc.  For more information go to www. Thenutritionsource.org 5. Drink alcohol in moderation- Limit alcohol intake to one drink or less per day. Never drink and drive. 6. Depression- Your emotional health is as important as your physical health.  If you're feeling down or losing interest in things you normally enjoy please talk to your healthcare provider about being screened for depression. 7. Dental visit- Brush and floss your teeth twice daily; visit your dentist twice a year. 8. Eye doctor- Get an eye exam at least every 2 years. 9. Helmet use- Always wear a helmet when riding a bicycle, motorcycle, rollerblading or skateboarding. 10. Safe sex- If you may be exposed to sexually transmitted infections, use a condom. 11. Seat belts- Seat belts can save your live; always wear one. 12. Smoke/Carbon Monoxide detectors- These detectors need to be installed on the appropriate level of your home. Replace batteries at least once a year. 13. Skin cancer- When out in the sun please cover up and use sunscreen 15 SPF or higher. 14. Violence- If anyone is threatening or hurting you, please tell your healthcare provider.   Smoking Cessation, Tips for Success If you are ready to quit smoking, congratulations! You have chosen to help yourself be healthier. Cigarettes bring nicotine, tar, carbon monoxide, and other irritants into your body. Your lungs, heart, and blood  vessels will be able to work better without these poisons. There are many different ways to quit smoking. Nicotine gum, nicotine patches, a nicotine inhaler, or nicotine nasal spray can help with physical craving. Hypnosis, support groups, and medicines help break the habit of smoking. WHAT THINGS CAN  I DO TO MAKE QUITTING EASIER?  Here are some tips to help you quit for good:  Pick a date when you will quit smoking completely. Tell all of your friends and family about your plan to quit on that date.  Do not try to slowly cut down on the number of cigarettes you are smoking. Pick a quit date and quit smoking completely starting on that day.  Throw away all cigarettes.   Clean and remove all ashtrays from your home, work, and car.   On a card, write down your reasons for quitting. Carry the card with you and read it when you get the urge to smoke.   Cleanse your body of nicotine. Drink enough water and fluids to keep your urine clear or pale yellow. Do this after quitting to flush the nicotine from your body.   Learn to predict your moods. Do not let a bad situation be your excuse to have a cigarette. Some situations in your life might tempt you into wanting a cigarette.   Never have "just one" cigarette. It leads to wanting another and another. Remind yourself of your decision to quit.   Change habits associated with smoking. If you smoked while driving or when feeling stressed, try other activities to replace smoking. Stand up when drinking your coffee. Brush your teeth after eating. Sit in a different chair when you read the paper. Avoid alcohol while trying to quit, and try to drink fewer caffeinated beverages. Alcohol and caffeine may urge you to smoke.   Avoid foods and drinks that can trigger a desire to smoke, such as sugary or spicy foods and alcohol.   Ask people who smoke not to smoke around you.   Have something planned to do right after eating or having a cup of coffee. For example, plan to take a walk or exercise.   Try a relaxation exercise to calm you down and decrease your stress. Remember, you may be tense and nervous for the first 2 weeks after you quit, but this will pass.   Find new activities to keep your hands busy. Play with a pen, coin, or rubber  band. Doodle or draw things on paper.   Brush your teeth right after eating. This will help cut down on the craving for the taste of tobacco after meals. You can also try mouthwash.   Use oral substitutes in place of cigarettes. Try using lemon drops, carrots, cinnamon sticks, or chewing gum. Keep them handy so they are available when you have the urge to smoke.   When you have the urge to smoke, try deep breathing.   Designate your home as a nonsmoking area.   If you are a heavy smoker, ask your health care provider about a prescription for nicotine chewing gum. It can ease your withdrawal from nicotine.   Reward yourself. Set aside the cigarette money you save and buy yourself something nice.   Look for support from others. Join a support group or smoking cessation program. Ask someone at home or at work to help you with your plan to quit smoking.   Always ask yourself, "Do I need this cigarette or is this just a reflex?" Tell yourself, "  Today, I choose not to smoke," or "I do not want to smoke." You are reminding yourself of your decision to quit.  Do not replace cigarette smoking with electronic cigarettes (commonly called e-cigarettes). The safety of e-cigarettes is unknown, and some may contain harmful chemicals.  If you relapse, do not give up! Plan ahead and think about what you will do the next time you get the urge to smoke.  HOW WILL I FEEL WHEN I QUIT SMOKING? You may have symptoms of withdrawal because your body is used to nicotine (the addictive substance in cigarettes). You may crave cigarettes, be irritable, feel very hungry, cough often, get headaches, or have difficulty concentrating. The withdrawal symptoms are only temporary. They are strongest when you first quit but will go away within 10-14 days. When withdrawal symptoms occur, stay in control. Think about your reasons for quitting. Remind yourself that these are signs that your body is healing and getting used  to being without cigarettes. Remember that withdrawal symptoms are easier to treat than the major diseases that smoking can cause.  Even after the withdrawal is over, expect periodic urges to smoke. However, these cravings are generally short lived and will go away whether you smoke or not. Do not smoke!  WHAT RESOURCES ARE AVAILABLE TO HELP ME QUIT SMOKING? Your health care provider can direct you to community resources or hospitals for support, which may include:  Group support.  Education.  Hypnosis.  Therapy. Document Released: 10/09/2003 Document Revised: 10/31/2012 Document Reviewed: 06/28/2012 [facility] Patient Information 2015 [place] [place]. This information is not intended to replace advice given to you by your health care provider. Make sure you discuss any questions you have with your health care provider.    You can come in anytime in last July or early August to get fasting labs drawn. Come to the [address]. You need to be fasting for 10-12 hours to get accurate values for lipids (cholesterol and triglyceride values).

## 2013-07-20 ENCOUNTER — Encounter: Payer: Self-pay | Admitting: Family Medicine

## 2013-07-20 NOTE — Progress Notes (Signed)
Subjective:    Patient ID: [name], female    DOB: 04-29-1965, 48 y.o.   MRN: [id number]  HPI This 48 y.o. AA female is here for CPE (first visit w/ this PCP). Pt is in good health but is struggling w/ weight loss and is a smoker. She would like to quit but is concerned about weight gain. She states she smokes < 1/2 ppd ( does not smoke at work). Acknowledges smoking associated w/ anxiety and would consider medication but has heard about side effects, especially w/ Chantix ("bad dreams" and strange behavior). Also has seasonal allergies; symptoms worsened by tobacco use.  HCM: PAP- per GYN (Dr. Billy Coastaavon).           MMG- Current (due in July).           IMM- Current.           CRS- Not yet and no family hx.  PMHx, Surg Hx, Soc and Fam Hx reviewed.  S/P C4-5, C5-6, C6-7 anterior decompression and fusion (Dr. Yevette Edwardsumonski- 09/16/2010).   Review of Systems  Constitutional: Negative.   HENT: Positive for congestion, postnasal drip and sneezing. Negative for dental problem, hearing loss, sinus pressure, sore throat, trouble swallowing and voice change.   Eyes: Negative.   Respiratory: Negative.   Cardiovascular: Negative.   Gastrointestinal: Negative.   Endocrine: Negative.   Genitourinary: Negative.   Musculoskeletal: Negative.   Skin: Negative.   Allergic/Immunologic: Negative.   Neurological: Negative.   Hematological: Negative.   Psychiatric/Behavioral: Negative.       Objective:   Physical Exam  Nursing note and vitals reviewed. Constitutional: She is oriented to person, place, and time. Vital signs are normal. She appears well-developed and well-nourished. No distress.  HENT:  Head: Normocephalic and atraumatic.  Right Ear: Hearing, tympanic membrane, external ear and ear canal normal.  Left Ear: Hearing, tympanic membrane, external ear and ear canal normal.  Nose: Mucosal edema present. No rhinorrhea, nasal deformity or septal deviation. Right sinus exhibits no maxillary  sinus tenderness and no frontal sinus tenderness. Left sinus exhibits no maxillary sinus tenderness and no frontal sinus tenderness.  Mouth/Throat: Uvula is midline and mucous membranes are normal. No oral lesions. Normal dentition. No dental caries. Posterior oropharyngeal erythema present. No oropharyngeal exudate or posterior oropharyngeal edema.  Eyes: EOM and lids are normal. Pupils are equal, round, and reactive to light. Right conjunctiva is injected. Left conjunctiva is injected. No scleral icterus.  Fundoscopic exam:      The right eye shows no arteriolar narrowing, no AV nicking and no papilledema. The right eye shows red reflex.       The left eye shows no arteriolar narrowing, no AV nicking and no papilledema. The left eye shows red reflex.  Neck: Full passive range of motion without pain. Neck supple. No JVD present. No spinous process tenderness and no muscular tenderness present. Decreased range of motion present. No mass and no thyromegaly present.  Cardiovascular: Normal rate, regular rhythm, S1 normal, S2 normal and normal heart sounds.   No extrasystoles are present. PMI is not displaced.  Exam reveals no gallop and no friction rub.   No murmur heard. Pulmonary/Chest: Effort normal and breath sounds normal. No respiratory distress.  Abdominal: Soft. Normal appearance, normal aorta and bowel sounds are normal. She exhibits no distension and no mass. There is no hepatosplenomegaly. There is no tenderness. There is no guarding and no CVA tenderness.  Genitourinary:  Deferred.  Musculoskeletal:  Cervical back: She exhibits decreased range of motion and spasm. She exhibits no tenderness, no bony tenderness, no edema, no deformity and no pain.       Thoracic back: Normal.       Lumbar back: Normal.  Remainder of exam unremarkable.  Lymphadenopathy:       Head (right side): No submental, no submandibular, no tonsillar, no preauricular, no posterior auricular and no occipital  adenopathy present.       Head (left side): No submental, no submandibular, no tonsillar, no preauricular, no posterior auricular and no occipital adenopathy present.    She has no cervical adenopathy.       Right: No inguinal and no supraclavicular adenopathy present.       Left: No inguinal and no supraclavicular adenopathy present.  Neurological: She is alert and oriented to person, place, and time. She has normal strength. She displays no atrophy and no tremor. No cranial nerve deficit or sensory deficit. She exhibits normal muscle tone. She displays a negative Romberg sign. Coordination and gait normal. She displays no Babinski's sign on the right side. She displays no Babinski's sign on the left side.  Reflex Scores:      Tricep reflexes are 1+ on the right side and 1+ on the left side.      Bicep reflexes are 2+ on the right side and 2+ on the left side.      Brachioradialis reflexes are 2+ on the right side and 2+ on the left side.      Patellar reflexes are 2+ on the right side and 2+ on the left side. Skin: Skin is warm, dry and intact. No ecchymosis, no lesion and no rash noted. She is not diaphoretic. No cyanosis or erythema. Nails show no clubbing.  Psychiatric: She has a normal mood and affect. Her speech is normal and behavior is normal. Judgment and thought content normal. Cognition and memory are normal.       Assessment & Plan:  Routine general medical examination at a health care facility  Tobacco user- Encouraged cessation; pt agrees trial of Wellbutrin and given "Tips for Success" print-out.  Obesity, unspecified - Given information about one-on-one counseling and coaching w/ a local professional.  Plan: Comprehensive metabolic panel, Vitamin D, 25-hydroxy (future labs).  Seasonal allergies- Continue OTC anti-histamine.  Need for hepatitis C screening test  Screening for hyperlipidemia - Plan: Lipid panel (future labs)  Meds ordered this encounter  Medications  .  buPROPion (WELLBUTRIN XL) 150 MG 24 hr tablet    Sig: Take 1 tablet (150 mg total) by mouth daily.    Dispense:  30 tablet    Refill:  2

## 2013-09-19 ENCOUNTER — Ambulatory Visit: Payer: BC Managed Care – PPO | Admitting: Family Medicine

## 2013-10-15 ENCOUNTER — Other Ambulatory Visit: Payer: Self-pay

## 2013-10-15 DIAGNOSIS — Z1231 Encounter for screening mammogram for malignant neoplasm of breast: Secondary | ICD-10-CM

## 2013-11-01 ENCOUNTER — Ambulatory Visit
Admission: RE | Admit: 2013-11-01 | Discharge: 2013-11-01 | Disposition: A | Discharge status: Home / Self Care | Payer: BC Managed Care – PPO | Source: Ambulatory Visit

## 2013-11-01 DIAGNOSIS — Z1231 Encounter for screening mammogram for malignant neoplasm of breast: Secondary | ICD-10-CM

## 2014-01-01 ENCOUNTER — Ambulatory Visit (INDEPENDENT_AMBULATORY_CARE_PROVIDER_SITE_OTHER): Payer: BC Managed Care – PPO | Admitting: Family Medicine

## 2014-01-01 VITALS — BP 131/83 | HR 69 | Temp 98.3°F | Resp 16 | Ht 65.0 in | Wt 190.4 lb

## 2014-01-01 DIAGNOSIS — D72829 Elevated white blood cell count, unspecified: Secondary | ICD-10-CM

## 2014-01-01 DIAGNOSIS — M25569 Pain in unspecified knee: Secondary | ICD-10-CM

## 2014-01-01 DIAGNOSIS — R202 Paresthesia of skin: Secondary | ICD-10-CM

## 2014-01-01 DIAGNOSIS — Z131 Encounter for screening for diabetes mellitus: Secondary | ICD-10-CM

## 2014-01-01 DIAGNOSIS — F17200 Nicotine dependence, unspecified, uncomplicated: Secondary | ICD-10-CM

## 2014-01-01 LAB — POCT CBC
Granulocyte percent: 54.8 %G (ref 37–80)
HCT, POC: 47.3 % (ref 37.7–47.9)
Hemoglobin: 15.1 g/dL (ref 12.2–16.2)
Lymph, poc: 4.6 — AB (ref 0.6–3.4)
MCH, POC: 29.6 pg (ref 27–31.2)
MCHC: 32 g/dL (ref 31.8–35.4)
MCV: 92.5 fL (ref 80–97)
MID (cbc): 0.5 (ref 0–0.9)
MPV: 8.2 fL (ref 0–99.8)
POC Granulocyte: 6.1 (ref 2–6.9)
POC LYMPH PERCENT: 41 % (ref 10–50)
POC MID %: 4.2 % (ref 0–12)
Platelet Count, POC: 232 10*3/uL (ref 142–424)
RBC: 5.11 M/uL (ref 4.04–5.48)
RDW, POC: 16.1 %
WBC: 11.2 10*3/uL — AB (ref 4.6–10.2)

## 2014-01-01 LAB — POCT GLYCOSYLATED HEMOGLOBIN (HGB A1C): Hemoglobin A1C: 5.6

## 2014-01-01 MED ORDER — CYCLOBENZAPRINE HCL 5 MG PO TABS: ORAL_TABLET | ORAL | Status: DC

## 2014-01-01 NOTE — Progress Notes (Signed)
 Chief Complaint:  Chief Complaint  Patient presents with  . Leg Pain    calf    HPI: [name] is a 48 y.o. female who is here foracute onset of  Bilateral calf pain yesterday, she states it is sensitive to the touch, she feels pain in the calf and dull numbness when she touches them. She was walking around the [employer] and also the [employer] and noticed the onset of sxs, legs , she states they  feel different, tight and numb and is just hard to describe. Barely rubbing her hand over her legs causes her to have  a painful tingle from calves to ankles. She denies any risk factors for DVT/PE, she is along time smoker, she denies more pain with walking and less pain with rest. She denies any HA, vision changes, n/v/abd pain, Cp, SOB, palpitations, assymetrric weakness, asymmetric swelling, drooping of the face.She does feel sluggish but was not able to sleep last night. She went into work today. She is here because she did not want to go to the ER. Sheis afraid she may have blood clots after reading on the internet   She denies any recent flu vaccine, densi any numbness in hands, denies DM  She thinks she has arthritis, she has had a 4 level AFCD with Dr Lynann Bologna in the past with minimal sxs relief   Wt Readings from Last 3 Encounters:  01/01/14 190 lb 6.4 oz (86.365 kg)  07/18/13 190 lb (86.183 kg)  06/07/13 193 lb (87.544 kg)   BP Readings from Last 3 Encounters:  01/01/14 131/83  07/18/13 111/78  06/07/13 120/80     Past Medical History  Diagnosis Date  . Allergy    Past Surgical History  Procedure Laterality Date  . Spinal cord decompression N/A 09/16/2010    Cervical spine- Dr. Lynann Bologna  . Spinal fusion N/A 09/16/2010    Cervical spine- Dr. Lynann Bologna   History   Social History  . Marital Status: Single    Spouse Name: N/A    Number of Children: N/A  . Years of Education: N/A   Social History Main Topics  . Smoking status: Current Every Day  Smoker    Types: Cigarettes  . Smokeless tobacco: Not on file  . Alcohol Use: Yes  . Drug Use: Not on file  . Sexual Activity: Yes   Other Topics Concern  . Not on file   Social History Narrative  . No narrative on file   No family history on file. Allergies  Allergen Reactions  . Claritin [Loratadine] Other (See Comments)    Discoloration of lips w/ swelling.  Marland Kitchen Penicillins    Prior to Admission medications   Not on File     ROS: The patient denies fevers, chills, night sweats, unintentional weight loss, chest pain, palpitations, wheezing, dyspnea on exertion, nausea, vomiting, abdominal pain, dysuria, hematuria, melena, weakness, + numbness, , +tingling.  Denis incontinence. Denies doubles vision  All other systems have been reviewed and were otherwise negative with the exception of those mentioned in the HPI and as above.    PHYSICAL EXAM: Filed Vitals:   01/01/14 2003  BP: 131/83  Pulse: 69  Temp: 98.3 F (36.8 C)  Resp: 16   Filed Vitals:   01/01/14 2003  Height: _0  (1.651 m)  Weight: 190 lb 6.4 oz (86.365 kg)   Body mass index is 31.68 kg/(m^2).  General: Alert, minimal  Distress, she is anxious  HEENT:  Normocephalic, atraumatic, oropharynx patent. EOMI, PERRLA, fundo normal Cardiovascular:  Regular rate and rhythm, no rubs murmurs or gallops.  No Carotid bruits, radial pulse intact. No pedal edema.  Respiratory: Clear to auscultation bilaterally.  No wheezes, rales, or rhonchi.  No cyanosis, no use of accessory musculature GI: No organomegaly, abdomen is soft and non-tender, positive bowel sounds.  No masses. Skin: No rashes. Neurologic: Facial musculature symmetric. Cn 2-12 grossly normal, neg meningeal signs, she has nol senstation ot light touch and decrease sensation bilaterraly in  to sharp touch She has no assymm swelling, calves are slightly tender bilaterally, there is no erythema/no warmth. + DP  Psychiatric: Patient is appropriate throughout  our interaction. Lymphatic: No cervical lymphadenopathy Musculoskeletal: Gait intact.5/5 strength, 2/2 DTR Negative for saddle anesthesia    LABS: Results for orders placed or performed in visit on 01/01/14  POCT CBC  Result Value Ref Range   WBC 11.2 (A) 4.6 - 10.2 K/uL   Lymph, poc 4.6 (A) 0.6 - 3.4   POC LYMPH PERCENT 41.0 10 - 50 %L   MID (cbc) 0.5 0 - 0.9   POC MID % 4.2 0 - 12 %M   POC Granulocyte 6.1 2 - 6.9   Granulocyte percent 54.8 37 - 80 %G   RBC 5.11 4.04 - 5.48 M/uL   Hemoglobin 15.1 12.2 - 16.2 g/dL   HCT, POC 47.3 37.7 - 47.9 %   MCV 92.5 80 - 97 fL   MCH, POC 29.6 27 - 31.2 pg   MCHC 32.0 31.8 - 35.4 g/dL   RDW, POC 16.1 %   Platelet Count, POC 232 142 - 424 K/uL   MPV 8.2 0 - 99.8 fL  POCT glycosylated hemoglobin (Hb A1C)  Result Value Ref Range   Hemoglobin A1C 5.6      EKG/XRAY:   Primary read interpreted by Dr. Marin Comment at [facility].   ASSESSMENT/PLAN: Encounter Diagnoses  Name Primary?  . Paresthesia Yes  . Pain in joint, lower leg, unspecified laterality   . Tobacco dependence   . Leukocytosis    48 y/o female with a PMH of tobacco abuse who presents with bilateral calf pain and is worried about a blood clot.  She was a little bit put off by the fact that we did not have a doppler in the office, I offered that she go to the ER, she Declined to go to ER She will get a stat doppler and arterial studies to rule out her concerns for blood clot and also my concern for claudication in the AM Her leukocytosis is possibly related to early viral sxs, she feels achey all over. Labs pending: CMP, TSH, ESR Preacutions given to go to the ER Rx Flexeril  F/u prn  Gross sideeffects, risk and benefits, and alternatives of medications d/w patient. Patient is aware that all medications have potential sideeffects and we are unable to predict every sideeffect or drug-drug interaction that may occur.  , [place], DO 01/01/2014 9:05 PM

## 2014-01-02 ENCOUNTER — Ambulatory Visit (HOSPITAL_COMMUNITY)
Admission: RE | Admit: 2014-01-02 | Discharge: 2014-01-02 | Disposition: A | Discharge status: Home / Self Care | Payer: BC Managed Care – PPO | Source: Ambulatory Visit | Attending: Family Medicine | Admitting: Family Medicine

## 2014-01-02 ENCOUNTER — Telehealth: Payer: Self-pay | Admitting: Radiology

## 2014-01-02 ENCOUNTER — Encounter: Payer: Self-pay | Admitting: Family Medicine

## 2014-01-02 DIAGNOSIS — M25569 Pain in unspecified knee: Secondary | ICD-10-CM

## 2014-01-02 DIAGNOSIS — M79606 Pain in leg, unspecified: Secondary | ICD-10-CM | POA: Diagnosis not present

## 2014-01-02 DIAGNOSIS — R202 Paresthesia of skin: Secondary | ICD-10-CM

## 2014-01-02 DIAGNOSIS — R209 Unspecified disturbances of skin sensation: Secondary | ICD-10-CM | POA: Insufficient documentation

## 2014-01-02 DIAGNOSIS — F17200 Nicotine dependence, unspecified, uncomplicated: Secondary | ICD-10-CM | POA: Insufficient documentation

## 2014-01-02 LAB — COMPLETE METABOLIC PANEL WITHOUT GFR
ALT: 18 U/L (ref 0–35)
AST: 16 U/L (ref 0–37)
Alkaline Phosphatase: 81 U/L (ref 39–117)
BUN: 12 mg/dL (ref 6–23)
Creat: 0.96 mg/dL (ref 0.50–1.10)

## 2014-01-02 LAB — COMPLETE METABOLIC PANEL WITH GFR
Albumin: 4.2 g/dL (ref 3.5–5.2)
CO2: 27 mEq/L (ref 19–32)
Calcium: 9.6 mg/dL (ref 8.4–10.5)
Chloride: 103 mEq/L (ref 96–112)
GFR, Est African American: 80 mL/min
GFR, Est Non African American: 70 mL/min
Glucose, Bld: 99 mg/dL (ref 70–99)
Potassium: 4.4 mEq/L (ref 3.5–5.3)
Sodium: 138 mEq/L (ref 135–145)
Total Bilirubin: 0.3 mg/dL (ref 0.2–1.2)
Total Protein: 7.1 g/dL (ref 6.0–8.3)

## 2014-01-02 LAB — SEDIMENTATION RATE: Sed Rate: 1 mm/hr (ref 0–22)

## 2014-01-02 LAB — TSH: TSH: 2.959 u[IU]/mL (ref 0.350–4.500)

## 2014-01-02 NOTE — Telephone Encounter (Signed)
Received call report from [facility]: negative for DVT; Dr Conley RollsLe made aware, instructed to tell patient doppler results and lab results.    Informed patient of results and labs normal; awaiting sed rate results still.

## 2014-01-02 NOTE — Progress Notes (Addendum)
*  PRELIMINARY RESULTS* Vascular Ultrasound Lower extremity venous duplex has been completed.  Preliminary findings: No evidence of DVT or baker's cyst bilaterally.  Bilateral PTA and Pero A were evaluated and demonstrate normal arterial flow.   [place]ed results to [place]. Patient can leave and will be contacted with further instructions.   Farrel Demark.Amoni Scallan Eunice, RDMS, RVT  01/02/2014, 3:31 PM

## 2014-09-19 ENCOUNTER — Telehealth: Payer: Self-pay | Admitting: Family Medicine

## 2014-09-19 ENCOUNTER — Ambulatory Visit (INDEPENDENT_AMBULATORY_CARE_PROVIDER_SITE_OTHER): Payer: BC Managed Care – PPO | Admitting: Family Medicine

## 2014-09-19 VITALS — BP 126/78 | HR 78 | Temp 98.2°F | Resp 18 | Ht 64.0 in | Wt 188.0 lb

## 2014-09-19 DIAGNOSIS — J029 Acute pharyngitis, unspecified: Secondary | ICD-10-CM

## 2014-09-19 DIAGNOSIS — H9392 Unspecified disorder of left ear: Secondary | ICD-10-CM | POA: Diagnosis not present

## 2014-09-19 DIAGNOSIS — J019 Acute sinusitis, unspecified: Secondary | ICD-10-CM | POA: Diagnosis not present

## 2014-09-19 LAB — POCT RAPID STREP A (OFFICE): Rapid Strep A Screen: NEGATIVE

## 2014-09-19 MED ORDER — AZITHROMYCIN 250 MG PO TABS: ORAL_TABLET | ORAL | Status: DC

## 2014-09-19 NOTE — Telephone Encounter (Signed)
Patient called to let me know that her throat feels like it is closing , she took the azithromycin since 4 pm and went to bed, since taking ither throat feels like it has been swollen and glands are swollen She has PCN allergy and is allergic to Claritin but has taken Benadryl in the past without problems. I have asked her to take some Bendryl and go straight to the ER. She is able to swallow, talk. She will go to [facility] ER, I have notifed the ER

## 2014-09-19 NOTE — Progress Notes (Signed)
Chief Complaint:  Chief Complaint  Patient presents with  . Sore Throat    x1 week had gotten better but now back   . Nasal Congestion  . Facial Pain    HPI: [name] is a 49 y.o. female who reports to [facility] today complaining of 1 week hsiotry of sinus sxs, no cough sxs, she has had a lot of nasal congestion and eye irritation. No fevers. She has had chills and she was cold.  She tried apple cider vinegar. Sore throat started yesterday. She is not feeling well. She has PCN allergy. Her ears do not hurt. Denies  asthma. No recent travels. Her son is sick as well. Has allergies .   Past Medical History  Diagnosis Date  . Allergy    Past Surgical History  Procedure Laterality Date  . Spinal cord decompression N/A 09/16/2010    Cervical spine- Dr. Yevette Edwards  . Spinal fusion N/A 09/16/2010    Cervical spine- Dr. Yevette Edwards   Social History   Social History  . Marital Status: Single    Spouse Name: N/A  . Number of Children: N/A  . Years of Education: N/A   Social History Main Topics  . Smoking status: Current Every Day Smoker    Types: Cigarettes  . Smokeless tobacco: None  . Alcohol Use: Yes  . Drug Use: None  . Sexual Activity: Yes   Other Topics Concern  . None   Social History Narrative   History reviewed. No pertinent family history. Allergies  Allergen Reactions  . Claritin [Loratadine] Other (See Comments)    Discoloration of lips w/ swelling.  Marland Kitchen Penicillins    Prior to Admission medications   Medication Sig Start Date End Date Taking? Authorizing Provider  olopatadine (PATANOL) 0.1 % ophthalmic solution Place 1 drop into both eyes 2 (two) times daily.   Yes Historical Provider, MD  cyclobenzaprine (FLEXERIL) 5 MG tablet Take 1-2 tabs PO qhs prn Patient not taking: Reported on 09/19/2014 01/01/14   Dejaun Vidrio P Keyshla Tunison, DO     ROS: The patient denies fevers, night sweats, unintentional weight loss, chest pain, palpitations, wheezing, dyspnea on exertion,  nausea, vomiting, abdominal pain, dysuria, hematuria, melena, numbness, weakness, or tingling.   All other systems have been reviewed and were otherwise negative with the exception of those mentioned in the HPI and as above.    PHYSICAL EXAM: Filed Vitals:   09/19/14 1139  BP: 126/78  Pulse: 78  Temp: 98.2 F (36.8 C)  Resp: 18   Body mass index is 32.25 kg/(m^2).   General: Alert, no acute distress HEENT:  Normocephalic, atraumatic, oropharynx patent. EOMI, PERRLA + maxillary sinus tenderness, left TM abnormally erythematous, no exudates.  Cardiovascular:  Regular rate and rhythm, no rubs murmurs or gallops.   Respiratory: Clear to auscultation bilaterally.  No wheezes, rales, or rhonchi.  No cyanosis, no use of accessory musculature Abdominal: No organomegaly, abdomen is soft and non-tender, positive bowel sounds. No masses. Skin: No rashes. Neurologic: Facial musculature symmetric. Psychiatric: Patient acts appropriately throughout our interaction. Lymphatic: No cervical or submandibular lymphadenopathy Musculoskeletal: Gait intact. No edema, tenderness   LABS: Results for orders placed or performed in visit on 09/19/14  POCT rapid strep A  Result Value Ref Range   Rapid Strep A Screen Negative Negative     EKG/XRAY:   Primary read interpreted by Dr. Conley Rolls at [facility].   ASSESSMENT/PLAN: Encounter Diagnoses  Name Primary?  . Acute rhinosinusitis Yes  .  Acute pharyngitis, unspecified pharyngitis type   . Abnormal ear exam, left     Rx azithromycin Push fluids Fu prn   Gross sideeffects, risk and benefits, and alternatives of medications d/w patient. Patient is aware that all medications have potential sideeffects and we are unable to predict every sideeffect or drug-drug interaction that may occur.  Cassell Voorhies DO  09/19/2014 1:09 PM

## 2014-09-19 NOTE — Patient Instructions (Signed)

## 2014-09-20 ENCOUNTER — Telehealth: Payer: Self-pay | Admitting: Family Medicine
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
## 2014-09-21 LAB — CULTURE, GROUP A STREP: Organism ID, Bacteria: NORMAL

## 2014-09-22 ENCOUNTER — Telehealth: Payer: Self-pay | Admitting: Family Medicine

## 2014-09-22 NOTE — Telephone Encounter (Signed)
LM about her son's labs. Also inquired how she was doing.

## 2014-10-29 ENCOUNTER — Emergency Department (HOSPITAL_COMMUNITY)
Admission: EM | Admit: 2014-10-29 | Discharge: 2014-10-29 | Disposition: A | Discharge status: Home / Self Care | Payer: BC Managed Care – PPO | Attending: Emergency Medicine | Admitting: Emergency Medicine

## 2014-10-29 ENCOUNTER — Encounter (HOSPITAL_COMMUNITY): Payer: Self-pay | Admitting: *Deleted

## 2014-10-29 ENCOUNTER — Emergency Department (HOSPITAL_COMMUNITY): Payer: BC Managed Care – PPO

## 2014-10-29 DIAGNOSIS — Z3202 Encounter for pregnancy test, result negative: Secondary | ICD-10-CM | POA: Diagnosis not present

## 2014-10-29 DIAGNOSIS — Z880 Allergy status to penicillin: Secondary | ICD-10-CM | POA: Diagnosis not present

## 2014-10-29 DIAGNOSIS — Z720 Tobacco use: Secondary | ICD-10-CM | POA: Diagnosis not present

## 2014-10-29 DIAGNOSIS — R5383 Other fatigue: Secondary | ICD-10-CM | POA: Diagnosis present

## 2014-10-29 DIAGNOSIS — Z79899 Other long term (current) drug therapy: Secondary | ICD-10-CM | POA: Diagnosis not present

## 2014-10-29 DIAGNOSIS — B349 Viral infection, unspecified: Secondary | ICD-10-CM | POA: Diagnosis not present

## 2014-10-29 DIAGNOSIS — E860 Dehydration: Secondary | ICD-10-CM | POA: Diagnosis not present

## 2014-10-29 LAB — CBC WITH DIFFERENTIAL/PLATELET
BASOS ABS: 0 10*3/uL (ref 0.0–0.1)
BASOS PCT: 1 %
Eosinophils Absolute: 0 10*3/uL (ref 0.0–0.7)
Eosinophils Relative: 1 %
HEMATOCRIT: 45 % (ref 36.0–46.0)
HEMOGLOBIN: 15.6 g/dL — AB (ref 12.0–15.0)
Lymphocytes Relative: 26 %
Lymphs Abs: 1.1 10*3/uL (ref 0.7–4.0)
MCH: 30.2 pg (ref 26.0–34.0)
MCHC: 34.7 g/dL (ref 30.0–36.0)
MCV: 87 fL (ref 78.0–100.0)
MONOS PCT: 22 %
Monocytes Absolute: 0.9 10*3/uL (ref 0.1–1.0)
NEUTROS ABS: 2 10*3/uL (ref 1.7–7.7)
NEUTROS PCT: 50 %
Platelets: 187 10*3/uL (ref 150–400)
RBC: 5.17 MIL/uL — ABNORMAL HIGH (ref 3.87–5.11)
RDW: 14.6 % (ref 11.5–15.5)
WBC: 4.1 10*3/uL (ref 4.0–10.5)

## 2014-10-29 LAB — URINALYSIS, ROUTINE W REFLEX MICROSCOPIC
BILIRUBIN URINE: NEGATIVE
GLUCOSE, UA: NEGATIVE mg/dL
Hgb urine dipstick: NEGATIVE
KETONES UR: 15 mg/dL — AB
Leukocytes, UA: NEGATIVE
Nitrite: NEGATIVE
PH: 6 (ref 5.0–8.0)
Protein, ur: NEGATIVE mg/dL
Specific Gravity, Urine: 1.019 (ref 1.005–1.030)
Urobilinogen, UA: 0.2 mg/dL (ref 0.0–1.0)

## 2014-10-29 LAB — COMPREHENSIVE METABOLIC PANEL
ALBUMIN: 3.5 g/dL (ref 3.5–5.0)
ALK PHOS: 55 U/L (ref 38–126)
ALT: 23 U/L (ref 14–54)
AST: 24 U/L (ref 15–41)
Anion gap: 9 (ref 5–15)
BILIRUBIN TOTAL: 0.7 mg/dL (ref 0.3–1.2)
BUN: 6 mg/dL (ref 6–20)
CALCIUM: 8.6 mg/dL — AB (ref 8.9–10.3)
CO2: 22 mmol/L (ref 22–32)
Chloride: 101 mmol/L (ref 101–111)
Creatinine, Ser: 0.83 mg/dL (ref 0.44–1.00)
GFR calc Af Amer: >60 mL/min (ref >60)
GFR calc non Af Amer: >60 mL/min (ref >60)
GLUCOSE: 100 mg/dL — AB (ref 65–99)
Potassium: 4.1 mmol/L (ref 3.5–5.1)
Sodium: 132 mmol/L — ABNORMAL LOW (ref 135–145)
TOTAL PROTEIN: 6.1 g/dL — AB (ref 6.5–8.1)

## 2014-10-29 LAB — PREGNANCY, URINE: Preg Test, Ur: NEGATIVE

## 2014-10-29 MED ORDER — SODIUM CHLORIDE 0.9 % IV BOLUS (SEPSIS)
2000.0000 mL | Freq: Once | INTRAVENOUS | Status: AC
  Administered 2014-10-29: 2000 mL via INTRAVENOUS

## 2014-10-29 NOTE — ED Provider Notes (Signed)
CSN: [id number]     Arrival date & time 10/29/14  0815 History   First MD Initiated Contact with Patient 10/29/14 [phone]     Chief Complaint  Patient presents with  . Fatigue      HPI Patient presents to the emergency department complaining of generalized weakness and generalized malaise.  She's had chills and riders over the past several days with decreased oral intake.  She feels diffusely weak at this time.  She denies a lateral arm or leg weakness.  No headaches.  No rash.  No dysuria or urinary frequency.  Denies cough or shortness of breath.  Her symptoms are mild to moderate in severity.  She reports maybe 1 or 2 episodes of diarrhea.  She denies nausea vomiting.   Past Medical History  Diagnosis Date  . Allergy    Past Surgical History  Procedure Laterality Date  . Spinal cord decompression N/A 09/16/2010    Cervical spine- Dr. Yevette Edwards  . Spinal fusion N/A 09/16/2010    Cervical spine- Dr. Yevette Edwards   No family history on file. Social History  Substance Use Topics  . Smoking status: Current Every Day Smoker    Types: Cigarettes  . Smokeless tobacco: None  . Alcohol Use: Yes   OB History    No data available     Review of Systems  All other systems reviewed and are negative.     Allergies  Azithromycin; Claritin; and Penicillins  Home Medications   Prior to Admission medications   Medication Sig Start Date End Date Taking? Authorizing Provider  Aspirin-Salicylamide-Caffeine (BC HEADACHE POWDER PO) Take 1 Package by mouth daily as needed (headache).   Yes Historical Provider, MD  olopatadine (PATANOL) 0.1 % ophthalmic solution Place 1 drop into both eyes 2 (two) times daily.   Yes Historical Provider, MD  PRESCRIPTION MEDICATION Take 0.5 tablets by mouth once. Sleep aid   Yes Historical Provider, MD  azithromycin (ZITHROMAX) 250 MG tablet Take 2 tabs po now then 1 tab po daily for the next 5 days Patient not taking: Reported on 10/29/2014 09/19/14   Thao P Le, DO    BP 123/89 mmHg  Pulse 82  Temp(Src) 98 F (36.7 C) (Oral)  Resp 20  Ht  (1.651 m)  Wt 180 lb (81.647 kg)  BMI 29.95 kg/m2  SpO2 95% Physical Exam  Constitutional: She is oriented to person, place, and time. She appears well-developed and well-nourished. No distress.  HENT:  Head: Normocephalic and atraumatic.  Eyes: EOM are normal.  Neck: Normal range of motion.  Cardiovascular: Normal rate, regular rhythm and normal heart sounds.   Pulmonary/Chest: Effort normal and breath sounds normal.  Abdominal: Soft. She exhibits no distension. There is no tenderness.  Musculoskeletal: Normal range of motion.  Neurological: She is alert and oriented to person, place, and time.  Skin: Skin is warm and dry.  Psychiatric: She has a normal mood and affect. Judgment normal.  Nursing note and vitals reviewed.   ED Course  Procedures (including critical care time) Labs Review Labs Reviewed  CBC WITH DIFFERENTIAL/PLATELET - Abnormal; Notable for the following:    RBC 5.17 (*)    Hemoglobin 15.6 (*)    All other components within normal limits  COMPREHENSIVE METABOLIC PANEL - Abnormal; Notable for the following:    Sodium 132 (*)    Glucose, Bld 100 (*)    Calcium 8.6 (*)    Total Protein 6.1 (*)    All other components within  normal limits  URINALYSIS, ROUTINE W REFLEX MICROSCOPIC (NOT AT [facility]) - Abnormal; Notable for the following:    APPearance HAZY (*)    Ketones, ur 15 (*)    All other components within normal limits  PREGNANCY, URINE    Imaging Review Dg Chest 2 View  10/29/2014   CLINICAL DATA:  Fever, weakness.  EXAM: CHEST  2 VIEW  COMPARISON:  February 27, 2012.  FINDINGS: The heart size and mediastinal contours are within normal limits. Both lungs are clear. No pneumothorax or pleural effusion is noted. The visualized skeletal structures are unremarkable.  IMPRESSION: No active cardiopulmonary disease.   Electronically Signed   By: Lupita Raider, M.D.   On: 10/29/2014  09:48   I have personally reviewed and evaluated these images and lab results as part of my medical decision-making.   EKG Interpretation None      MDM   Final diagnoses:  Dehydration  Viral syndrome    Likely viral process with volume depletion.  Patient be worked out for fever and chills including chest and urine.  Labs.  IV fluids.  We'll reevaluate.  11:52 AM Patient is feeling better at this time.  Labs urine without significant abnormality.  Suspect viral process.  Improving with IV fluids.  Discharge home in good condition.    Azalia Bilis, MD 10/29/14 [phone]

## 2014-10-29 NOTE — ED Notes (Signed)
Pt states that she has had generalized weakness and generally "uncomfortable".  Pt states that last night she began having abdominal pain as well. Denies N/V. Pt states that last night she was so tired that she took a sleeping pill. Pt reports decreased appetite.

## 2014-10-29 NOTE — Discharge Instructions (Signed)

## 2014-11-16 ENCOUNTER — Emergency Department (HOSPITAL_COMMUNITY): Payer: BC Managed Care – PPO

## 2014-11-16 ENCOUNTER — Emergency Department (HOSPITAL_COMMUNITY)
Admission: EM | Admit: 2014-11-16 | Discharge: 2014-11-16 | Disposition: A | Discharge status: Home / Self Care | Payer: BC Managed Care – PPO | Attending: Emergency Medicine | Admitting: Emergency Medicine

## 2014-11-16 ENCOUNTER — Encounter (HOSPITAL_COMMUNITY): Payer: Self-pay | Admitting: Emergency Medicine

## 2014-11-16 DIAGNOSIS — S3992XA Unspecified injury of lower back, initial encounter: Secondary | ICD-10-CM | POA: Diagnosis present

## 2014-11-16 DIAGNOSIS — X58XXXA Exposure to other specified factors, initial encounter: Secondary | ICD-10-CM | POA: Insufficient documentation

## 2014-11-16 DIAGNOSIS — Y9389 Activity, other specified: Secondary | ICD-10-CM | POA: Diagnosis not present

## 2014-11-16 DIAGNOSIS — Z880 Allergy status to penicillin: Secondary | ICD-10-CM | POA: Insufficient documentation

## 2014-11-16 DIAGNOSIS — Z720 Tobacco use: Secondary | ICD-10-CM | POA: Insufficient documentation

## 2014-11-16 DIAGNOSIS — M5431 Sciatica, right side: Secondary | ICD-10-CM | POA: Diagnosis not present

## 2014-11-16 DIAGNOSIS — Z3202 Encounter for pregnancy test, result negative: Secondary | ICD-10-CM | POA: Insufficient documentation

## 2014-11-16 DIAGNOSIS — Y998 Other external cause status: Secondary | ICD-10-CM | POA: Diagnosis not present

## 2014-11-16 DIAGNOSIS — Y92810 Car as the place of occurrence of the external cause: Secondary | ICD-10-CM | POA: Insufficient documentation

## 2014-11-16 LAB — CBC WITH DIFFERENTIAL/PLATELET
BASOS ABS: 0.1 10*3/uL (ref 0.0–0.1)
BASOS PCT: 1 %
Eosinophils Absolute: 0.1 10*3/uL (ref 0.0–0.7)
Eosinophils Relative: 1 %
HEMATOCRIT: 43 % (ref 36.0–46.0)
HEMOGLOBIN: 14.9 g/dL (ref 12.0–15.0)
LYMPHS PCT: 40 %
Lymphs Abs: 3 10*3/uL (ref 0.7–4.0)
MCH: 30.2 pg (ref 26.0–34.0)
MCHC: 34.7 g/dL (ref 30.0–36.0)
MCV: 87 fL (ref 78.0–100.0)
MONO ABS: 0.8 10*3/uL (ref 0.1–1.0)
Monocytes Relative: 10 %
NEUTROS ABS: 3.7 10*3/uL (ref 1.7–7.7)
NEUTROS PCT: 48 %
Platelets: 328 10*3/uL (ref 150–400)
RBC: 4.94 MIL/uL (ref 3.87–5.11)
RDW: 15.2 % (ref 11.5–15.5)
WBC: 7.5 10*3/uL (ref 4.0–10.5)

## 2014-11-16 LAB — BASIC METABOLIC PANEL
ANION GAP: 6 (ref 5–15)
BUN: 12 mg/dL (ref 6–20)
CHLORIDE: 106 mmol/L (ref 101–111)
CO2: 26 mmol/L (ref 22–32)
Calcium: 9.1 mg/dL (ref 8.9–10.3)
Creatinine, Ser: 0.99 mg/dL (ref 0.44–1.00)
GFR calc non Af Amer: >60 mL/min (ref >60)
GLUCOSE: 89 mg/dL (ref 65–99)
POTASSIUM: 4.5 mmol/L (ref 3.5–5.1)
Sodium: 138 mmol/L (ref 135–145)

## 2014-11-16 LAB — I-STAT BETA HCG BLOOD, ED (MC, WL, AP ONLY): I-stat hCG, quantitative: <5 m[IU]/mL (ref <5)

## 2014-11-16 MED ORDER — HYDROMORPHONE HCL 1 MG/ML IJ SOLN
1.0000 mg | Freq: Once | INTRAMUSCULAR | Status: AC
  Administered 2014-11-16: 1 mg via INTRAVENOUS
  Filled 2014-11-16: qty 1

## 2014-11-16 MED ORDER — KETOROLAC TROMETHAMINE 30 MG/ML IJ SOLN
30.0000 mg | Freq: Once | INTRAMUSCULAR | Status: AC
  Administered 2014-11-16: 30 mg via INTRAVENOUS
  Filled 2014-11-16: qty 1

## 2014-11-16 MED ORDER — IBUPROFEN 800 MG PO TABS: 800.0000 mg | ORAL_TABLET | Freq: Three times a day (TID) | ORAL | Status: DC

## 2014-11-16 MED ORDER — OXYCODONE-ACETAMINOPHEN 5-325 MG PO TABS: 1.0000 | ORAL_TABLET | ORAL | Status: DC | PRN

## 2014-11-16 MED ORDER — CYCLOBENZAPRINE HCL 5 MG PO TABS: 5.0000 mg | ORAL_TABLET | Freq: Three times a day (TID) | ORAL | Status: DC | PRN

## 2014-11-16 MED ORDER — DIAZEPAM 5 MG/ML IJ SOLN
5.0000 mg | Freq: Once | INTRAMUSCULAR | Status: AC
  Administered 2014-11-16: 5 mg via INTRAVENOUS
  Filled 2014-11-16: qty 2
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
## 2014-11-16 NOTE — Discharge Instructions (Signed)
Take motrin for pain.   Take flexeril for muscle spasms.   Take vicodin for severe pain. Do NOT drive with it.   See neurosurgeon for follow up.   Return to ER if you have worse back pain, trouble walking, weakness, numbness.
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
## 2014-11-16 NOTE — ED Notes (Signed)
Patient states she wants her husband to help her in the car.

## 2014-11-27 ENCOUNTER — Telehealth (HOSPITAL_BASED_OUTPATIENT_CLINIC_OR_DEPARTMENT_OTHER): Payer: Self-pay | Admitting: Emergency Medicine

## 2014-12-11 ENCOUNTER — Other Ambulatory Visit: Payer: Self-pay | Admitting: Neurological Surgery

## 2014-12-11 DIAGNOSIS — M542 Cervicalgia: Secondary | ICD-10-CM

## 2014-12-11 DIAGNOSIS — M4727 Other spondylosis with radiculopathy, lumbosacral region: Secondary | ICD-10-CM

## 2014-12-22 ENCOUNTER — Encounter: Payer: Self-pay | Admitting: Internal Medicine

## 2014-12-31 ENCOUNTER — Ambulatory Visit
Admission: RE | Admit: 2014-12-31 | Discharge: 2014-12-31 | Disposition: A | Discharge status: Home / Self Care | Payer: BC Managed Care – PPO | Source: Ambulatory Visit | Attending: Neurological Surgery | Admitting: Neurological Surgery

## 2014-12-31 DIAGNOSIS — M542 Cervicalgia: Secondary | ICD-10-CM

## 2014-12-31 DIAGNOSIS — M4727 Other spondylosis with radiculopathy, lumbosacral region: Secondary | ICD-10-CM

## 2014-12-31 MED ORDER — GADOBENATE DIMEGLUMINE 529 MG/ML IV SOLN
18.0000 mL | Freq: Once | INTRAVENOUS | Status: AC | PRN
  Administered 2014-12-31: 18 mL via INTRAVENOUS

## 2015-07-21 ENCOUNTER — Ambulatory Visit (INDEPENDENT_AMBULATORY_CARE_PROVIDER_SITE_OTHER): Payer: BC Managed Care – PPO | Admitting: Urgent Care

## 2015-07-21 VITALS — BP 122/80 | HR 78 | Temp 98.1°F | Resp 18 | Ht 65.0 in | Wt 192.0 lb

## 2015-07-21 DIAGNOSIS — F17200 Nicotine dependence, unspecified, uncomplicated: Secondary | ICD-10-CM | POA: Diagnosis not present

## 2015-07-21 DIAGNOSIS — Z889 Allergy status to unspecified drugs, medicaments and biological substances status: Secondary | ICD-10-CM

## 2015-07-21 DIAGNOSIS — J029 Acute pharyngitis, unspecified: Secondary | ICD-10-CM

## 2015-07-21 DIAGNOSIS — Z9109 Other allergy status, other than to drugs and biological substances: Secondary | ICD-10-CM | POA: Diagnosis not present

## 2015-07-21 DIAGNOSIS — J22 Unspecified acute lower respiratory infection: Secondary | ICD-10-CM

## 2015-07-21 DIAGNOSIS — J988 Other specified respiratory disorders: Secondary | ICD-10-CM

## 2015-07-21 DIAGNOSIS — R059 Cough, unspecified: Secondary | ICD-10-CM

## 2015-07-21 DIAGNOSIS — R05 Cough: Secondary | ICD-10-CM | POA: Diagnosis not present

## 2015-07-21 MED ORDER — CETIRIZINE HCL 10 MG PO TABS: 10.0000 mg | ORAL_TABLET | Freq: Every day | ORAL | Status: DC

## 2015-07-21 MED ORDER — BUPROPION HCL ER (SR) 150 MG PO TB12: 150.0000 mg | ORAL_TABLET | Freq: Two times a day (BID) | ORAL | Status: DC

## 2015-07-21 MED ORDER — HYDROCODONE-HOMATROPINE 5-1.5 MG/5ML PO SYRP: 5.0000 mL | ORAL_SOLUTION | Freq: Every evening | ORAL | Status: DC | PRN

## 2015-07-21 MED ORDER — BENZONATATE 100 MG PO CAPS: 100.0000 mg | ORAL_CAPSULE | Freq: Three times a day (TID) | ORAL | Status: DC | PRN

## 2015-07-21 MED ORDER — CIPROFLOXACIN HCL 500 MG PO TABS: 500.0000 mg | ORAL_TABLET | Freq: Two times a day (BID) | ORAL | Status: DC

## 2015-07-21 NOTE — Progress Notes (Signed)
    MRN: [id number] DOB: [birth year]  Subjective:   [name] is a 50 y.o. female presenting for chief complaint of Cough  Reports 3 week history of worsening productive cough that elicits shob, chest pain and throat pain. Also has had chills, sinus pain, post-nasal drainage, malaise, nausea without vomiting or belly pain. Has tried Mucinex DM. Smokes 2 packs per week. Admits history of allergies but has not taken anything for this.   Karin GoldenLorraine currently has no medications in their medication list. Also is allergic to azithromycin; claritin; and penicillins.  Karin GoldenLorraine  has a past medical history of Allergy and Arthritis. Also  has past surgical history that includes Spinal cord decompression (N/A, 09/16/2010) and Spinal fusion (N/A, 09/16/2010).  Objective:   Vitals: BP 122/80 mmHg  Pulse 78  Temp(Src) 98.1 F (36.7 C) (Oral)  Resp 18  Ht 5\' 5"  (1.651 m)  Wt 192 lb (87.091 kg)  BMI 31.95 kg/m2  SpO2 97%  LMP 07/14/2015  Wt Readings from Last 3 Encounters:  07/21/15 192 lb (87.091 kg)  10/29/14 180 lb (81.647 kg)  09/19/14 188 lb (85.276 kg)    Physical Exam  Constitutional: She is oriented to person, place, and time. She appears well-developed and well-nourished.  HENT:  TM's intact bilaterally, no effusions or erythema. Nasal turbinates erythematous with thick yellow mucus. No sinus tenderness. Postnasal drip present, without oropharyngeal exudates, erythema or abscesses.  Eyes: Right eye exhibits no discharge. Left eye exhibits no discharge. No scleral icterus.  Neck: Normal range of motion. Neck supple.  Cardiovascular: Normal rate, regular rhythm and intact distal pulses.  Exam reveals no gallop and no friction rub.   No murmur heard. Pulmonary/Chest: No respiratory distress. She has no wheezes. She has no rales.  Lymphadenopathy:    She has no cervical adenopathy.  Neurological: She is alert and oriented to person, place, and time.  Skin: Skin is warm and dry.    Assessment and Plan :   1. Lower respiratory infection 2. Cough 3. Sore throat - Start ciprofloxacin to cover for LRI. Use Hycodan and Tessalon for cough and sore throat. RTC in 1 week if no improvement. Consider switching to doxy and/or adding steroid course.  4. Multiple allergies - Start Zyrtec.  5. Tobacco use disorder - Counseled on smoking cessation. Start Wellbutrin, f/u in 6-8 weeks.  Wallis BambergMario Kaily Wragg, PA-C [facility] [phone] 07/21/2015 11:55 AM

## 2015-07-21 NOTE — Patient Instructions (Addendum)
Cough, Adult Coughing is a reflex that clears your throat and your airways. Coughing helps to heal and protect your lungs. It is normal to cough occasionally, but a cough that happens with other symptoms or lasts a long time may be a sign of a condition that needs treatment. A cough may last only 2-3 weeks (acute), or it may last longer than 8 weeks (chronic). CAUSES Coughing is commonly caused by:  Breathing in substances that irritate your lungs.  A viral or bacterial respiratory infection.  Allergies.  Asthma.  Postnasal drip.  Smoking.  Acid backing up from the stomach into the esophagus (gastroesophageal reflux).  Certain medicines.  Chronic lung problems, including COPD (or rarely, lung cancer).  Other medical conditions such as heart failure. HOME CARE INSTRUCTIONS  Pay attention to any changes in your symptoms. Take these actions to help with your discomfort:  Take medicines only as told by your health care provider.  If you were prescribed an antibiotic medicine, take it as told by your health care provider. Do not stop taking the antibiotic even if you start to feel better.  Talk with your health care provider before you take a cough suppressant medicine.  Drink enough fluid to keep your urine clear or pale yellow.  If the air is dry, use a cold steam vaporizer or humidifier in your bedroom or your home to help loosen secretions.  Avoid anything that causes you to cough at work or at home.  If your cough is worse at night, try sleeping in a semi-upright position.  Avoid cigarette smoke. If you smoke, quit smoking. If you need help quitting, ask your health care provider.  Avoid caffeine.  Avoid alcohol.  Rest as needed. SEEK MEDICAL CARE IF:   You have new symptoms.  You cough up pus.  Your cough does not get better after 2-3 weeks, or your cough gets worse.  You cannot control your cough with suppressant medicines and you are losing sleep.  You  develop pain that is getting worse or pain that is not controlled with pain medicines.  You have a fever.  You have unexplained weight loss.  You have night sweats. SEEK IMMEDIATE MEDICAL CARE IF:  You cough up blood.  You have difficulty breathing.  Your heartbeat is very fast.   This information is not intended to replace advice given to you by your health care provider. Make sure you discuss any questions you have with your health care provider.   Document Released: 07/09/2010 Document Revised: 10/01/2014 Document Reviewed: 03/19/2014 Elsevier Interactive Patient Education 2016 [employer].    Smoking Cessation, Tips for Success If you are ready to quit smoking, congratulations! You have chosen to help yourself be healthier. Cigarettes bring nicotine, tar, carbon monoxide, and other irritants into your body. Your lungs, heart, and blood vessels will be able to work better without these poisons. There are many different ways to quit smoking. Nicotine gum, nicotine patches, a nicotine inhaler, or nicotine nasal spray can help with physical craving. Hypnosis, support groups, and medicines help break the habit of smoking. WHAT THINGS CAN I DO TO MAKE QUITTING EASIER?  Here are some tips to help you quit for good:  Pick a date when you will quit smoking completely. Tell all of your friends and family about your plan to quit on that date.  Do not try to slowly cut down on the number of cigarettes you are smoking. Pick a quit date and quit smoking completely starting  on that day.  Throw away all cigarettes.   Clean and remove all ashtrays from your home, work, and car.  On a card, write down your reasons for quitting. Carry the card with you and read it when you get the urge to smoke.  Cleanse your body of nicotine. Drink enough water and fluids to keep your urine clear or pale yellow. Do this after quitting to flush the nicotine from your body.  Learn to predict your moods. Do  not let a bad situation be your excuse to have a cigarette. Some situations in your life might tempt you into wanting a cigarette.  Never have "just one" cigarette. It leads to wanting another and another. Remind yourself of your decision to quit.  Change habits associated with smoking. If you smoked while driving or when feeling stressed, try other activities to replace smoking. Stand up when drinking your coffee. Brush your teeth after eating. Sit in a different chair when you read the paper. Avoid alcohol while trying to quit, and try to drink fewer caffeinated beverages. Alcohol and caffeine may urge you to smoke.  Avoid foods and drinks that can trigger a desire to smoke, such as sugary or spicy foods and alcohol.  Ask people who smoke not to smoke around you.  Have something planned to do right after eating or having a cup of coffee. For example, plan to take a walk or exercise.  Try a relaxation exercise to calm you down and decrease your stress. Remember, you may be tense and nervous for the first 2 weeks after you quit, but this will pass.  Find new activities to keep your hands busy. Play with a pen, coin, or rubber band. Doodle or draw things on paper.  Brush your teeth right after eating. This will help cut down on the craving for the taste of tobacco after meals. You can also try mouthwash.   Use oral substitutes in place of cigarettes. Try using lemon drops, carrots, cinnamon sticks, or chewing gum. Keep them handy so they are available when you have the urge to smoke.  When you have the urge to smoke, try deep breathing.  Designate your home as a nonsmoking area.  If you are a heavy smoker, ask your health care provider about a prescription for nicotine chewing gum. It can ease your withdrawal from nicotine.  Reward yourself. Set aside the cigarette money you save and buy yourself something nice.  Look for support from others. Join a support group or smoking cessation  program. Ask someone at home or at work to help you with your plan to quit smoking.  Always ask yourself, "Do I need this cigarette or is this just a reflex?" Tell yourself, "Today, I choose not to smoke," or "I do not want to smoke." You are reminding yourself of your decision to quit.  Do not replace cigarette smoking with electronic cigarettes (commonly called e-cigarettes). The safety of e-cigarettes is unknown, and some may contain harmful chemicals.  If you relapse, do not give up! Plan ahead and think about what you will do the next time you get the urge to smoke. HOW WILL I FEEL WHEN I QUIT SMOKING? You may have symptoms of withdrawal because your body is used to nicotine (the addictive substance in cigarettes). You may crave cigarettes, be irritable, feel very hungry, cough often, get headaches, or have difficulty concentrating. The withdrawal symptoms are only temporary. They are strongest when you first quit but will go away  within 10-14 days. When withdrawal symptoms occur, stay in control. Think about your reasons for quitting. Remind yourself that these are signs that your body is healing and getting used to being without cigarettes. Remember that withdrawal symptoms are easier to treat than the major diseases that smoking can cause.  Even after the withdrawal is over, expect periodic urges to smoke. However, these cravings are generally short lived and will go away whether you smoke or not. Do not smoke! WHAT RESOURCES ARE AVAILABLE TO HELP ME QUIT SMOKING? Your health care provider can direct you to community resources or hospitals for support, which may include:  Group support.  Education.  Hypnosis.  Therapy.   This information is not intended to replace advice given to you by your health care provider. Make sure you discuss any questions you have with your health care provider.   Document Released: 10/09/2003 Document Revised: 01/31/2014 Document Reviewed:  06/28/2012 Elsevier Interactive Patient Education 2016 [employer].    Bupropion sustained-release tablets (smoking cessation) What is this medicine? BUPROPION (byoo PROE pee on) is used to help people quit smoking. This medicine may be used for other purposes; ask your health care provider or pharmacist if you have questions. What should I tell my health care provider before I take this medicine? They need to know if you have any of these conditions: -an eating disorder, such as anorexia or bulimia -bipolar disorder or psychosis -diabetes or high blood sugar, treated with medication -glaucoma -head injury or brain tumor -heart disease, previous heart attack, or irregular heart beat -high blood pressure -kidney or liver disease -seizures -suicidal thoughts or a previous suicide attempt -Tourette's syndrome -weight loss -an unusual or allergic reaction to bupropion, other medicines, foods, dyes, or preservatives -breast-feeding -pregnant or trying to become pregnant How should I use this medicine? Take this medicine by mouth with a glass of water. Follow the directions on the prescription label. You can take it with or without food. If it upsets your stomach, take it with food. Do not cut, crush or chew this medicine. Take your medicine at regular intervals. If you take this medicine more than once a day, take your second dose at least 8 hours after you take your first dose. To limit difficulty in sleeping, avoid taking this medicine at bedtime. Do not take your medicine more often than directed. Do not stop taking this medicine suddenly except upon the advice of your doctor. Stopping this medicine too quickly may cause serious side effects. A special MedGuide will be given to you by the pharmacist with each prescription and refill. Be sure to read this information carefully each time. Talk to your pediatrician regarding the use of this medicine in children. Special care may be  needed. Overdosage: If you think you have taken too much of this medicine contact a poison control center or emergency room at once. NOTE: This medicine is only for you. Do not share this medicine with others. What if I miss a dose? If you miss a dose, skip the missed dose and take your next tablet at the regular time. There should be at least 8 hours between doses. Do not take double or extra doses. What may interact with this medicine? Do not take this medicine with any of the following medications: -linezolid -MAOIs like Azilect, Carbex, Eldepryl, Marplan, Nardil, and Parnate -methylene blue (injected into a vein) -other medicines that contain bupropion like Wellbutrin This medicine may also interact with the following medications: -alcohol -certain medicines  for anxiety or sleep -certain medicines for blood pressure like metoprolol, propranolol -certain medicines for depression or psychotic disturbances -certain medicines for HIV or AIDS like efavirenz, lopinavir, nelfinavir, ritonavir -certain medicines for irregular heart beat like propafenone, flecainide -certain medicines for Parkinson's disease like amantadine, levodopa -certain medicines for seizures like carbamazepine, phenytoin, phenobarbital -cimetidine -clopidogrel -cyclophosphamide -furazolidone -isoniazid -nicotine -orphenadrine -procarbazine -steroid medicines like prednisone or cortisone -stimulant medicines for attention disorders, weight loss, or to stay awake -tamoxifen -theophylline -thiotepa -ticlopidine -tramadol -warfarin This list may not describe all possible interactions. Give your health care provider a list of all the medicines, herbs, non-prescription drugs, or dietary supplements you use. Also tell them if you smoke, drink alcohol, or use illegal drugs. Some items may interact with your medicine. What should I watch for while using this medicine? Visit your doctor or health care professional for  regular checks on your progress. This medicine should be used together with a patient support program. It is important to participate in a behavioral program, counseling, or other support program that is recommended by your health care professional. Patients and their families should watch out for new or worsening thoughts of suicide or depression. Also watch out for sudden changes in feelings such as feeling anxious, agitated, panicky, irritable, hostile, aggressive, impulsive, severely restless, overly excited and hyperactive, or not being able to sleep. If this happens, especially at the beginning of treatment or after a change in dose, call your health care professional. Avoid alcoholic drinks while taking this medicine. Drinking excessive alcoholic beverages, using sleeping or anxiety medicines, or quickly stopping the use of these agents while taking this medicine may increase your risk for a seizure. Do not drive or use heavy machinery until you know how this medicine affects you. This medicine can impair your ability to perform these tasks. Do not take this medicine close to bedtime. It may prevent you from sleeping. Your mouth may get dry. Chewing sugarless gum or sucking hard candy, and drinking plenty of water may help. Contact your doctor if the problem does not go away or is severe. Do not use nicotine patches or chewing gum without the advice of your doctor or health care professional while taking this medicine. You may need to have your blood pressure taken regularly if your doctor recommends that you use both nicotine and this medicine together. What side effects may I notice from receiving this medicine? Side effects that you should report to your doctor or health care professional as soon as possible: -allergic reactions like skin rash, itching or hives, swelling of the face, lips, or tongue -breathing problems -changes in vision -confusion -fast or irregular  heartbeat -hallucinations -increased blood pressure -redness, blistering, peeling or loosening of the skin, including inside the mouth -seizures -suicidal thoughts or other mood changes -unusually weak or tired -vomiting Side effects that usually do not require medical attention (report to your doctor or health care professional if they continue or are bothersome): -change in sex drive or performance -constipation -headache -loss of appetite -nausea -tremors -weight loss This list may not describe all possible side effects. Call your doctor for medical advice about side effects. You may report side effects to FDA at 1-800-FDA-1088. Where should I keep my medicine? Keep out of the reach of children. Store at room temperature between 20 and 25 degrees C (68 and 77 degrees F). Protect from light. Keep container tightly closed. Throw away any unused medicine after the expiration date. NOTE: This sheet is a  summary. It may not cover all possible information. If you have questions about this medicine, talk to your doctor, pharmacist, or health care provider.    2016, Elsevier/Gold Standard. (2012-09-07 10:55:10)     IF you received an x-ray today, you will receive an invoice from [facility]. Please contact [facility] at [phone] with questions or concerns regarding your invoice.   IF you received labwork today, you will receive an invoice from [facility]. Please contact [facility] at [phone] with questions or concerns regarding your invoice.   Our billing staff will not be able to assist you with questions regarding bills from these companies.  You will be contacted with the lab results as soon as they are available. The fastest way to get your results is to activate your My Chart account. Instructions are located on the last page of this paperwork. If you have not heard from us regarding the results in 2 weeks, please contact this  office.    We recommend that you schedule a mammogram for breast cancer screening. Typically, you do not need a referral to do this. Please contact a local imaging center to schedule your mammogram.  [facility] - [phone]  *ask for the Radiology Department [facility]) - [phone] or [phone]  [facility] - [phone] [facility] - [phone] [facility] - [phone]  *ask for the Radiology Department [facility] - [phone]  *ask for the Radiology Department [facility] - [phone]  *ask for the Mammography Department [facility] - [phone]) [phone]

## 2015-11-05 ENCOUNTER — Ambulatory Visit (INDEPENDENT_AMBULATORY_CARE_PROVIDER_SITE_OTHER): Payer: BC Managed Care – PPO | Admitting: Physician Assistant

## 2015-11-05 VITALS — BP 120/74 | HR 75 | Temp 97.9°F | Resp 17 | Ht 65.0 in | Wt 192.0 lb

## 2015-11-05 DIAGNOSIS — M5010 Cervical disc disorder with radiculopathy, unspecified cervical region: Secondary | ICD-10-CM

## 2015-11-05 LAB — CBC WITH DIFFERENTIAL/PLATELET
BASOS ABS: 0 {cells}/uL (ref 0–200)
Basophils Relative: 0 %
EOS PCT: 1 %
Eosinophils Absolute: 85 cells/uL (ref 15–500)
HCT: 47.4 % — ABNORMAL HIGH (ref 35.0–45.0)
Hemoglobin: 15.7 g/dL — ABNORMAL HIGH (ref 11.7–15.5)
Lymphocytes Relative: 41 %
Lymphs Abs: 3485 cells/uL (ref 850–3900)
MCH: 29.5 pg (ref 27.0–33.0)
MCHC: 33.1 g/dL (ref 32.0–36.0)
MCV: 89.1 fL (ref 80.0–100.0)
MONOS PCT: 10 %
MPV: 10.6 fL (ref 7.5–12.5)
Monocytes Absolute: 850 cells/uL (ref 200–950)
NEUTROS ABS: 4080 {cells}/uL (ref 1500–7800)
Neutrophils Relative %: 48 %
PLATELETS: 253 10*3/uL (ref 140–400)
RBC: 5.32 MIL/uL — ABNORMAL HIGH (ref 3.80–5.10)
RDW: 15.1 % — ABNORMAL HIGH (ref 11.0–15.0)
WBC: 8.5 10*3/uL (ref 3.8–10.8)

## 2015-11-05 MED ORDER — GABAPENTIN 300 MG PO CAPS: 300.0000 mg | ORAL_CAPSULE | Freq: Three times a day (TID) | ORAL | 0 refills | Status: DC

## 2015-11-05 MED ORDER — PREDNISONE 20 MG PO TABS: ORAL_TABLET | ORAL | 0 refills | Status: DC
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
## 2015-11-05 NOTE — Patient Instructions (Addendum)
I would like you to return in 7 days if there is no improvement. If there is improvement, return in 2 weeks for follow-up. You can use the Flexeril at 5 mg 3 times a day well. You may increase it to 10 mg when you're not operating heavy machinery. Please be aware of sedation.    IF you received an x-ray today, you will receive an invoice from [facility]. Please contact [facility] at [phone] with questions or concerns regarding your invoice.   IF you received labwork today, you will receive an invoice from [facility]. Please contact [facility] at [phone] with questions or concerns regarding your invoice.   Our billing staff will not be able to assist you with questions regarding bills from these companies.  You will be contacted with the lab results as soon as they are available. The fastest way to get your results is to activate your My Chart account. Instructions are located on the last page of this paperwork. If you have not heard from us regarding the results in 2 weeks, please contact this office.

## 2015-11-06 LAB — SEDIMENTATION RATE: SED RATE: 1 mm/h (ref 0–30)

## 2015-11-27 ENCOUNTER — Telehealth: Payer: Self-pay | Admitting: *Deleted

## 2015-11-27 ENCOUNTER — Ambulatory Visit (INDEPENDENT_AMBULATORY_CARE_PROVIDER_SITE_OTHER): Payer: BC Managed Care – PPO | Admitting: Family Medicine

## 2015-11-27 VITALS — BP 122/72 | HR 77 | Temp 98.1°F | Resp 17 | Ht 65.0 in | Wt 197.0 lb

## 2015-11-27 DIAGNOSIS — L509 Urticaria, unspecified: Secondary | ICD-10-CM

## 2015-11-27 MED ORDER — PREDNISONE 20 MG PO TABS: ORAL_TABLET | ORAL | 0 refills | Status: DC

## 2015-11-27 MED ORDER — HYDROXYZINE HCL 25 MG PO TABS: 12.5000 mg | ORAL_TABLET | Freq: Three times a day (TID) | ORAL | 0 refills | Status: DC | PRN

## 2015-11-27 MED ORDER — RANITIDINE HCL 150 MG PO TABS: 150.0000 mg | ORAL_TABLET | Freq: Two times a day (BID) | ORAL | 0 refills | Status: DC

## 2015-11-27 NOTE — Progress Notes (Signed)
Patient ID: [name], female    DOB: [birth year], 50 y.o.   MRN: [id number]  PCP: Tonye PearsonOLITTLE, ROBERT P, MD (Inactive)  Chief Complaint  Patient presents with  . Allergies    allergic reaction medicine per patient    Subjective:   HPI 50 year old female, presents for evaluation of an allergic reaction.  [facility] an [facility]. She reports recently purchasing some over the counter B12 drops and taking them. She noted slight facial redness initially but did not attribute the discoloration to the supplement. She took the drops the following day and the facial redness increased and itching accompanied. Later the itching and hives erupted all over her body and she was advised to come in and be evaluated. Denies any recent changes in perfumes, detergents, or eating any new foods. She has been taking Gabapentin for pain related to cervical radiculopathy for over 3 weeks denies any skin eruption since taking medication.   Social History   Social History  . Marital status: Single    Spouse name: N/A  . Number of children: N/A  . Years of education: N/A   Occupational History  . Not on file.   Social History Main Topics  . Smoking status: Current Every Day Smoker    Packs/day: 0.25    Years: 33.00    Types: Cigarettes  . Smokeless tobacco: Never Used  . Alcohol use Yes  . Drug use: No  . Sexual activity: Yes   Other Topics Concern  . Not on file   Social History Narrative  . No narrative on file    Family History  Problem Relation Age of Onset  . Cancer Brother    Review of Systems HPI  Patient Active Problem List   Diagnosis Date Noted  . Tobacco user 07/18/2013  . Obesity, unspecified 07/18/2013  . Seasonal allergies 07/18/2013  . S/P cervical spinal fusion 02/27/2012     Prior to Admission medications   Medication Sig Start Date End Date Taking? Authorizing Provider  diphenhydrAMINE (BENADRYL) 25 mg capsule Take 25 mg by mouth every 6  (six) hours as needed.   Yes Historical Provider, MD  gabapentin (NEURONTIN) 300 MG capsule Take 1 capsule (300 mg total) by mouth 3 (three) times daily. Start with 1 tablet at night first day.  Then 1 tablet twice per day for 2nd day.  Then 1 tablet three times per day for 1 day. 11/05/15  Yes Stephanie D English, PA  buPROPion (WELLBUTRIN SR) 150 MG 12 hr tablet Take 1 tablet (150 mg total) by mouth 2 (two) times daily. Patient not taking: Reported on 11/27/2015 07/21/15   Wallis BambergMario Mani, PA-C  cetirizine (ZYRTEC) 10 MG tablet Take 1 tablet (10 mg total) by mouth daily. Patient not taking: Reported on 11/27/2015 07/21/15   Wallis BambergMario Mani, PA-C  predniSONE (DELTASONE) 20 MG tablet Take 3 PO QAM x3days, 2 PO QAM x2days, 1 PO QAM x2days Patient not taking: Reported on 11/27/2015 11/05/15   Collie SiadStephanie D English, PA     Allergies  Allergen Reactions  . Azithromycin Shortness Of Breath and Swelling  . Claritin [Loratadine] Other (See Comments)    Discoloration of lips w/ swelling.  Marland Kitchen. Penicillins Other (See Comments)    Has patient had a PCN reaction causing immediate rash, facial/tongue/throat swelling, SOB or lightheadedness with hypotension: unknown Has patient had a PCN reaction causing severe rash involving mucus membranes or skin necrosis: unknown Has patient had a PCN reaction that  required hospitalization unknown Has patient had a PCN reaction occurring within the last 10 years: yes If all of the above answers are "NO", then may proceed with Cephalosporin use.        Objective:  Physical Exam  Constitutional: She is oriented to person, place, and time. She appears well-developed and well-nourished.  HENT:  Head: Normocephalic and atraumatic.  Right Ear: External ear normal.  Left Ear: External ear normal.  Nose: Nose normal.  Neck: Normal range of motion. Neck supple.  Cardiovascular: Normal rate, regular rhythm, normal heart sounds and intact distal pulses.   Pulmonary/Chest: Effort normal  and breath sounds normal.  Musculoskeletal: Normal range of motion.  Neurological: She is alert and oriented to person, place, and time.  Skin: Rash noted. Rash is urticarial.  Forehead, bilateral face, bilateral upper extremities   Psychiatric: She has a normal mood and affect. Her behavior is normal. Judgment and thought content normal.    Vitals:   11/27/15 1618  BP: 122/72  Pulse: 77  Resp: 17  Temp: 98.1 F (36.7 C)     Assessment & Plan:  1. Hives - PredniSONE (DELTASONE) 20 MG tablet; Take 3 PO QAM x3days, 2 PO QAM x2days, 1 PO QAM x2days    - HydrOXYzine (ATARAX/VISTARIL) 25 MG tablet; Take 0.5-1 tablets (12.5-25 mg total) by mouth every 8 (eight) hours as needed for itching.    - Ranitidine (ZANTAC) 150 MG tablet; Take 1 tablet (150 mg total) by mouth 2 (two) times daily.   Information given about warnings signs of anaphylaxis.  Godfrey PickKimberly S. Tiburcio PeaHarris, MSN, FNP-C [facility]

## 2015-11-27 NOTE — Patient Instructions (Addendum)
Hives Take Prednisone 20 mg,  in mornings with breakfast as follows:  Take 3 pills for 3 days, Take 2 pills for 3 days, and Take 1 pill for 3 days.  Complete all medication.  Take 150 mg twice daily for Ranitidine until hives and itching resolves.  Avoid taking anymore B12.  Itching Take hydroxyzine (Vistaril) 12.5-25 mg every 8 hours as needed for itching. May cause significant drowsiness,  avoid driving while taking this medication.     IF you received an x-ray today, you will receive an invoice from [facility]. Please contact [facility] at [phone] with questions or concerns regarding your invoice.   IF you received labwork today, you will receive an invoice from [facility]. Please contact [facility] at [phone] with questions or concerns regarding your invoice.   Our billing staff will not be able to assist you with questions regarding bills from these companies.  You will be contacted with the lab results as soon as they are available. The fastest way to get your results is to activate your My Chart account. Instructions are located on the last page of this paperwork. If you have not heard from us regarding the results in 2 weeks, please contact this office.      Hives Hives are itchy, red, swollen areas of the skin. They can vary in size and location on your body. Hives can come and go for hours or several days (acute hives) or for several weeks (chronic hives). Hives do not spread from person to person (noncontagious). They may get worse with scratching, exercise, and emotional stress. CAUSES   Allergic reaction to food, additives, or drugs.  Infections, including the common cold.  Illness, such as vasculitis, lupus, or thyroid disease.  Exposure to sunlight, heat, or cold.  Exercise.  Stress.  Contact with chemicals. SYMPTOMS   Red or white swollen patches on the skin. The patches may change size, shape, and  location quickly and repeatedly.  Itching.  Swelling of the hands, feet, and face. This may occur if hives develop deeper in the skin. DIAGNOSIS  Your caregiver can usually tell what is wrong by performing a physical exam. Skin or blood tests may also be done to determine the cause of your hives. In some cases, the cause cannot be determined. TREATMENT  Mild cases usually get better with medicines such as antihistamines. Severe cases may require an emergency epinephrine injection. If the cause of your hives is known, treatment includes avoiding that trigger.  HOME CARE INSTRUCTIONS   Avoid causes that trigger your hives.  Take antihistamines as directed by your caregiver to reduce the severity of your hives. Non-sedating or low-sedating antihistamines are usually recommended. Do not drive while taking an antihistamine.  Take any other medicines prescribed for itching as directed by your caregiver.  Wear loose-fitting clothing.  Keep all follow-up appointments as directed by your caregiver. SEEK MEDICAL CARE IF:   You have persistent or severe itching that is not relieved with medicine.  You have painful or swollen joints. SEEK IMMEDIATE MEDICAL CARE IF:   You have a fever.  Your tongue or lips are swollen.  You have trouble breathing or swallowing.  You feel tightness in the throat or chest.  You have abdominal pain. These problems may be the first sign of a life-threatening allergic reaction. Call your local emergency services (911 in [place] MAKE SURE YOU:   Understand these instructions.  Will watch your condition.  Will get help right  away if you are not doing well or get worse.   This information is not intended to replace advice given to you by your health care provider. Make sure you discuss any questions you have with your health care provider.   Document Released: 01/10/2005 Document Revised: 01/15/2013 Document Reviewed: 04/05/2011 Elsevier Interactive Patient  Education [employer] Elsevier Inc.

## 2015-11-27 NOTE — Telephone Encounter (Signed)
Patient called and stated that she may be having an allergic reaction.  Patient told operator that it was from a med we given her.  I checked chart and she was last here on 11/05/15.  After speaking with patient she stated that she think it may be from vit b12 liquid drops.  She denies any SOB or chest pain.  She has hives on her face.  Advised to stop medication, get some benadryl, and recommended that she be seen.  She stated she could not come in at this time.  Advised if she gets any SOB, chest pain, or throat swelling please get help, call 911 or go to the nearest ER.

## 2015-12-01 DIAGNOSIS — M5010 Cervical disc disorder with radiculopathy, unspecified cervical region: Secondary | ICD-10-CM | POA: Insufficient documentation

## 2015-12-13 ENCOUNTER — Other Ambulatory Visit: Payer: Self-pay | Admitting: Physician Assistant

## 2015-12-13 DIAGNOSIS — M5010 Cervical disc disorder with radiculopathy, unspecified cervical region: Secondary | ICD-10-CM

## 2015-12-15 ENCOUNTER — Telehealth: Payer: Self-pay

## 2015-12-15 NOTE — Telephone Encounter (Signed)
Patient called and states that she has broken out again.  She states that she had an allergic reaction to a medication she was prescribed and was then prescribed gabapentin to relieve it.  She said that she is out of the gabapentin and is broken out again.  She is wondering if this is normal.  Please advise with further instruction.  [phone]

## 2015-12-18 NOTE — Telephone Encounter (Signed)
[name], please advise. There is also a refill req forwarded to you for her gabapentin.

## 2015-12-18 NOTE — Telephone Encounter (Signed)
Patient reports that she had a rash earlier in the week similar to the allergic reaction when she came in for an OV on 11/27/2015. She ran out of her Gabapentin and could not remember if this is what she was prescribed on 11/27/2015 for an allergic reaction. I advised she take bendaryl 50mg and come in for an evaluation of her rash. She refused stating that her rash was now resolved. However, she would take either Zyrtec and Zantac OR benadryl if her rash came back. I declined gabapentin refill since I am unable to establish whether or not this is causing her rash. She verbalized understanding. 

## 2015-12-18 NOTE — Telephone Encounter (Signed)
Judeth CornfieldStephanie, you gave pt 1 mos as trial. Do you want to cont for pt? Pended with dose she would be on now and 90 day supplies.

## 2015-12-18 NOTE — Telephone Encounter (Signed)
Patient reports that she had a rash earlier in the week similar to the allergic reaction when she came in for an OV on 11/27/2015. She ran out of her Gabapentin and could not remember if this is what she was prescribed on 11/27/2015 for an allergic reaction. I advised she take bendaryl 50mg  and come in for an evaluation of her rash. She refused stating that her rash was now resolved. However, she would take either Zyrtec and Zantac OR benadryl if her rash came back. I declined gabapentin refill since I am unable to establish whether or not this is causing her rash. She verbalized understanding.

## 2016-02-19 ENCOUNTER — Ambulatory Visit (INDEPENDENT_AMBULATORY_CARE_PROVIDER_SITE_OTHER): Payer: BC Managed Care – PPO | Admitting: Family Medicine

## 2016-02-19 VITALS — BP 128/88 | HR 74 | Temp 98.1°F | Resp 16 | Ht 65.0 in | Wt 192.0 lb

## 2016-02-19 DIAGNOSIS — R220 Localized swelling, mass and lump, head: Secondary | ICD-10-CM | POA: Diagnosis not present

## 2016-02-19 DIAGNOSIS — L509 Urticaria, unspecified: Secondary | ICD-10-CM | POA: Diagnosis not present

## 2016-02-19 MED ORDER — HYDROXYZINE HCL 25 MG PO TABS: 12.5000 mg | ORAL_TABLET | Freq: Three times a day (TID) | ORAL | 0 refills | Status: DC | PRN

## 2016-02-19 MED ORDER — RANITIDINE HCL 150 MG PO TABS: 150.0000 mg | ORAL_TABLET | Freq: Two times a day (BID) | ORAL | 0 refills | Status: DC

## 2016-02-19 MED ORDER — METHYLPREDNISOLONE SODIUM SUCC 125 MG IJ SOLR
80.0000 mg | Freq: Once | INTRAMUSCULAR | Status: AC
  Administered 2016-02-19: 80 mg via INTRAMUSCULAR

## 2016-02-19 NOTE — Patient Instructions (Addendum)
Start Raniditine 150 mg twice daily for next 10 days.  Take hydroxyzine 25 mg at bedtime for antihistamine action.  Report the emergency department is shortness of breath, trouble swallowing, or increased swelling of the tongue.   IF you received an x-ray today, you will receive an invoice from [facility]. Please contact [facility] at [phone] with questions or concerns regarding your invoice.   IF you received labwork today, you will receive an invoice from [facility]. Please contact [facility] at [phone] with questions or concerns regarding your invoice.   Our billing staff will not be able to assist you with questions regarding bills from these companies.  You will be contacted with the lab results as soon as they are available. The fastest way to get your results is to activate your My Chart account. Instructions are located on the last page of this paperwork. If you have not heard from us regarding the results in 2 weeks, please contact this office.     Angioedema Angioedema is sudden swelling in the body. The swelling can happen in any part of the body. It often happens on the skin and causes itchy, bumpy patches (hives) to form. This condition may:  Happen only one time.  Happen more than one time. It may come back at random times.  Keep coming back for a number of years. Someday it may stop coming back. Follow these instructions at home:  Take over-the-counter and prescription medicines only as told by your doctor.  If you were given medicines for emergency allergy treatment, always carry them with you.  Wear a medical bracelet as told by your doctor.  Avoid the things that cause your attacks (triggers).  If this condition was passed to you from your parents and you want to have kids, talk to your doctor. Your kids may also have this condition. Contact a doctor if:  You have another attack.  Your attacks happen more often, even after you take  steps to prevent them.  This condition was passed to you by your parents and you want to have kids. Get help right away if:  Your mouth, tongue, or lips get very swollen.  You have trouble breathing.  You have trouble swallowing.  You pass out (faint). This information is not intended to replace advice given to you by your health care provider. Make sure you discuss any questions you have with your health care provider. Document Released: 12/29/2008 Document Revised: 08/12/2015 Document Reviewed: 07/21/2015 Elsevier Interactive Patient Education  2017 [employer]

## 2016-02-19 NOTE — Progress Notes (Signed)
Patient ID: [name], female    DOB: 05/24/1965, 51 y.o.   MRN: [id number]  PCP: No primary care provider on file.  Chief Complaint  Patient presents with  . Allergic Reaction    swollen tongue having difficulty swolling     Subjective:  HPI 51 year old  presents for evaluation of enlarged tongue x 3 day. Throat feels tight at night and feels the sensation of coating  inside of mouth. Increased salvation and feels that she is experiencing some intermittent difficult of swallowing. Denies shortness of breath, wheezing, or ingestion of any new foods or drink. Denies hives, itching of skin, nausea, or vomiting.  Social History   Social History  . Marital status: Single    Spouse name: N/A  . Number of children: N/A  . Years of education: N/A   Occupational History  . Not on file.   Social History Main Topics  . Smoking status: Current Every Day Smoker    Packs/day: 0.25    Years: 33.00    Types: Cigarettes  . Smokeless tobacco: Never Used  . Alcohol use Yes  . Drug use: No  . Sexual activity: Yes   Other Topics Concern  . Not on file   Social History Narrative  . No narrative on file    Family History  Problem Relation Age of Onset  . Cancer Brother    Review of Systems  See HPI  Patient Active Problem List   Diagnosis Date Noted  . Cervical disc disorder with radiculopathy of cervical region 12/01/2015  . Tobacco user 07/18/2013  . Obesity, unspecified 07/18/2013  . Seasonal allergies 07/18/2013  . S/P cervical spinal fusion 02/27/2012    Prior to Admission medications   Medication Sig Start Date End Date Taking? Authorizing Provider  diphenhydrAMINE (BENADRYL) 25 mg capsule Take 25 mg by mouth every 6 (six) hours as needed.   Yes Historical Provider, MD  gabapentin (NEURONTIN) 300 MG capsule Take 1 capsule (300 mg total) by mouth 3 (three) times daily. Start with 1 tablet at night first day.  Then 1 tablet twice per day for 2nd day.  Then 1  tablet three times per day for 1 day. 11/05/15  Yes Stephanie D English, PA  hydrOXYzine (ATARAX/VISTARIL) 25 MG tablet Take 0.5-1 tablets (12.5-25 mg total) by mouth every 8 (eight) hours as needed for itching. 11/27/15  Yes Doyle AskewKimberly Stephenia Terin Dierolf, FNP  predniSONE (DELTASONE) 20 MG tablet Take 3 PO QAM x3days, 2 PO QAM x2days, 1 PO QAM x2days 11/27/15  Yes Doyle AskewKimberly Stephenia Ohm Dentler, FNP  ranitidine (ZANTAC) 150 MG tablet Take 1 tablet (150 mg total) by mouth 2 (two) times daily. 11/27/15  Yes Doyle AskewKimberly Stephenia Arty Lantzy, FNP  buPROPion [facility](WELLBUTRIN SR) 150 MG 12 hr tablet Take 1 tablet (150 mg total) by mouth 2 (two) times daily. Patient not taking: Reported on 11/27/2015 07/21/15   Wallis BambergMario Mani, PA-C  cetirizine (ZYRTEC) 10 MG tablet Take 1 tablet (10 mg total) by mouth daily. Patient not taking: Reported on 11/27/2015 07/21/15   Wallis BambergMario Mani, PA-C    Past Medical, Surgical Family and Social History reviewed and updated.    Objective:   Today's Vitals   02/19/16 1730  BP: 128/88  Pulse: 74  Resp: 16  Temp: 98.1 F (36.7 C)  TempSrc: Oral  SpO2: 98%  Weight: 192 lb (87.1 kg)  Height: 5\' 5"  (1.651 m)    Wt Readings from Last 3 Encounters:  02/19/16 192 lb (87.1 kg)  11/27/15 197 lb (89.4 kg)  11/05/15 192 lb (87.1 kg)   Physical Exam  Constitutional: She is oriented to person, place, and time. She appears well-developed and well-nourished.  HENT:  Head: Normocephalic and atraumatic.  Mouth/Throat: Uvula is midline. No oropharyngeal exudate, posterior oropharyngeal edema or posterior oropharyngeal erythema.  Eyes: Pupils are equal, round, and reactive to light.  Neck: Normal range of motion. Neck supple.  Cardiovascular: Normal rate, regular rhythm, normal heart sounds and intact distal pulses.   Pulmonary/Chest: Effort normal and breath sounds normal.  Abdominal: Soft. Bowel sounds are normal.  Musculoskeletal: Normal range of motion.  Neurological: She is alert and oriented to  person, place, and time.  Skin: Skin is warm and dry.  Psychiatric: She has a normal mood and affect. Her behavior is normal. Judgment and thought content normal.     Assessment & Plan:  1. Tongue swelling, tongue is appropriately centered in mouth, open airway, oropharynx viewable noneryethmatous or edematous. Patient reports sensation of tongue heaviness. Will treat empirically for allergic reaction. - MethylPREDNISolone sodium succinate (SOLU-MEDROL) 125 mg/2 mL injection 80 mg; Inject 1.28 mLs (80 mg total) into the muscle once.  Plan: -Ranitidine 150 mg tablet, daily for  -Hydroxyzine (Atarax/Vistaril) 25 mg, 12.5-25 mg, every 8 hours PRN -Ambulatory referral to ENT  Return for care as needed.   Godfrey Pick. Tiburcio Pea, MSN, FNP-C Primary Care at [facility] [phone]

## 2016-03-03 ENCOUNTER — Other Ambulatory Visit: Payer: Self-pay | Admitting: Obstetrics and Gynecology

## 2016-03-03 DIAGNOSIS — Z1231 Encounter for screening mammogram for malignant neoplasm of breast: Secondary | ICD-10-CM

## 2016-04-12 ENCOUNTER — Ambulatory Visit
Admission: RE | Admit: 2016-04-12 | Discharge: 2016-04-12 | Disposition: A | Discharge status: Home / Self Care | Payer: BC Managed Care – PPO | Source: Ambulatory Visit | Attending: Obstetrics and Gynecology | Admitting: Obstetrics and Gynecology

## 2016-04-12 DIAGNOSIS — Z1231 Encounter for screening mammogram for malignant neoplasm of breast: Secondary | ICD-10-CM

## 2016-05-05 ENCOUNTER — Ambulatory Visit (INDEPENDENT_AMBULATORY_CARE_PROVIDER_SITE_OTHER): Payer: BC Managed Care – PPO | Admitting: Emergency Medicine

## 2016-05-05 VITALS — BP 138/86 | HR 74 | Temp 98.3°F | Resp 18 | Ht 65.0 in | Wt 203.8 lb

## 2016-05-05 DIAGNOSIS — M25561 Pain in right knee: Secondary | ICD-10-CM | POA: Diagnosis not present

## 2016-05-05 DIAGNOSIS — M2550 Pain in unspecified joint: Secondary | ICD-10-CM | POA: Diagnosis not present

## 2016-05-05 DIAGNOSIS — G8929 Other chronic pain: Secondary | ICD-10-CM | POA: Diagnosis not present

## 2016-05-05 DIAGNOSIS — M25552 Pain in left hip: Secondary | ICD-10-CM | POA: Diagnosis not present

## 2016-05-05 DIAGNOSIS — M25572 Pain in left ankle and joints of left foot: Secondary | ICD-10-CM | POA: Diagnosis not present

## 2016-05-05 DIAGNOSIS — M25562 Pain in left knee: Secondary | ICD-10-CM

## 2016-05-05 DIAGNOSIS — I10 Essential (primary) hypertension: Secondary | ICD-10-CM | POA: Diagnosis not present

## 2016-05-05 DIAGNOSIS — M25551 Pain in right hip: Secondary | ICD-10-CM

## 2016-05-05 MED ORDER — AMLODIPINE BESYLATE 5 MG PO TABS: 5.0000 mg | ORAL_TABLET | Freq: Every day | ORAL | 3 refills | Status: DC

## 2016-05-05 NOTE — Patient Instructions (Addendum)
   IF you received an x-ray today, you will receive an invoice from [facility]. Please contact [facility] at [phone] with questions or concerns regarding your invoice.   IF you received labwork today, you will receive an invoice from [facility]. Please contact [facility] at [phone] with questions or concerns regarding your invoice.   Our billing staff will not be able to assist you with questions regarding bills from these companies.  You will be contacted with the lab results as soon as they are available. The fastest way to get your results is to activate your My Chart account. Instructions are located on the last page of this paperwork. If you have not heard from us regarding the results in 2 weeks, please contact this office.      Hypertension Hypertension is another name for high blood pressure. High blood pressure forces your heart to work harder to pump blood. This can cause problems over time. There are two numbers in a blood pressure reading. There is a top number (systolic) over a bottom number (diastolic). It is best to have a blood pressure below 120/80. Healthy choices can help lower your blood pressure. You may need medicine to help lower your blood pressure if:  Your blood pressure cannot be lowered with healthy choices.  Your blood pressure is higher than 130/80. Follow these instructions at home: Eating and drinking   If directed, follow the DASH eating plan. This diet includes:  Filling half of your plate at each meal with fruits and vegetables.  Filling one quarter of your plate at each meal with whole grains. Whole grains include whole wheat pasta, brown rice, and whole grain bread.  Eating or drinking low-fat dairy products, such as skim milk or low-fat yogurt.  Filling one quarter of your plate at each meal with low-fat (lean) proteins. Low-fat proteins include fish, skinless chicken, eggs, beans, and tofu.  Avoiding fatty meat,  cured and processed meat, or chicken with skin.  Avoiding premade or processed food.  Eat less than 1,500 mg of salt (sodium) a day.  Limit alcohol use to no more than 1 drink a day for nonpregnant women and 2 drinks a day for men. One drink equals 12 oz of beer, 5 oz of wine, or 1 oz of hard liquor. Lifestyle   Work with your doctor to stay at a healthy weight or to lose weight. Ask your doctor what the best weight is for you.  Get at least 30 minutes of exercise that causes your heart to beat faster (aerobic exercise) most days of the week. This may include walking, swimming, or biking.  Get at least 30 minutes of exercise that strengthens your muscles (resistance exercise) at least 3 days a week. This may include lifting weights or pilates.  Do not use any products that contain nicotine or tobacco. This includes cigarettes and e-cigarettes. If you need help quitting, ask your doctor.  Check your blood pressure at home as told by your doctor.  Keep all follow-up visits as told by your doctor. This is important. Medicines   Take over-the-counter and prescription medicines only as told by your doctor. Follow directions carefully.  Do not skip doses of blood pressure medicine. The medicine does not work as well if you skip doses. Skipping doses also puts you at risk for problems.  Ask your doctor about side effects or reactions to medicines that you should watch for. Contact a doctor if:  You think you are having a   reaction to the medicine you are taking.  You have headaches that keep coming back (recurring).  You feel dizzy.  You have swelling in your ankles.  You have trouble with your vision. Get help right away if:  You get a very bad headache.  You start to feel confused.  You feel weak or numb.  You feel faint.  You get very bad pain in your:  Chest.  Belly (abdomen).  You throw up (vomit) more than once.  You have trouble  breathing. Summary  Hypertension is another name for high blood pressure.  Making healthy choices can help lower blood pressure. If your blood pressure cannot be controlled with healthy choices, you may need to take medicine. This information is not intended to replace advice given to you by your health care provider. Make sure you discuss any questions you have with your health care provider. Document Released: 06/29/2007 Document Revised: 12/09/2015 Document Reviewed: 12/09/2015 Elsevier Interactive Patient Education  2017 Elsevier Inc.  

## 2016-05-05 NOTE — Progress Notes (Signed)
[name] 51 y.o.   Chief Complaint  Patient presents with  . Hypertension    pt states BP is high   . Back Pain    HISTORY OF PRESENT ILLNESS: This is a 51 y.o. female complaining of elevated blood presssure for the past several weeks; systolic 180's and diastolic 90's; asymptomatic but had a headache 1 day.  HPI   Prior to Admission medications   Medication Sig Start Date End Date Taking? Authorizing Provider  cetirizine (ZYRTEC) 10 MG tablet Take 1 tablet (10 mg total) by mouth daily. 07/21/15  Yes Wallis Bamberg, PA-C  buPROPion [facility] SR) 150 MG 12 hr tablet Take 1 tablet (150 mg total) by mouth 2 (two) times daily. Patient not taking: Reported on 11/27/2015 07/21/15   Wallis Bamberg, PA-C  diphenhydrAMINE (BENADRYL) 25 mg capsule Take 25 mg by mouth every 6 (six) hours as needed.    Historical Provider, MD  gabapentin (NEURONTIN) 300 MG capsule Take 1 capsule (300 mg total) by mouth 3 (three) times daily. Start with 1 tablet at night first day.  Then 1 tablet twice per day for 2nd day.  Then 1 tablet three times per day for 1 day. Patient not taking: Reported on 05/05/2016 11/05/15   Collie Siad English, PA  hydrOXYzine (ATARAX/VISTARIL) 25 MG tablet Take 0.5-1 tablets (12.5-25 mg total) by mouth every 8 (eight) hours as needed for itching. Patient not taking: Reported on 05/05/2016 02/19/16   Doyle Askew, FNP  predniSONE (DELTASONE) 20 MG tablet Take 3 PO QAM x3days, 2 PO QAM x2days, 1 PO QAM x2days Patient not taking: Reported on 05/05/2016 11/27/15   Doyle Askew, FNP  ranitidine (ZANTAC) 150 MG tablet Take 1 tablet (150 mg total) by mouth 2 (two) times daily. Patient not taking: Reported on 05/05/2016 02/19/16   Doyle Askew, FNP    Allergies  Allergen Reactions  . Azithromycin Shortness Of Breath and Swelling  . Claritin [Loratadine] Other (See Comments)    Discoloration of lips w/ swelling.  Marland Kitchen Penicillins Other (See Comments)   Has patient had a PCN reaction causing immediate rash, facial/tongue/throat swelling, SOB or lightheadedness with hypotension: unknown Has patient had a PCN reaction causing severe rash involving mucus membranes or skin necrosis: unknown Has patient had a PCN reaction that required hospitalization unknown Has patient had a PCN reaction occurring within the last 10 years: yes If all of the above answers are "NO", then may proceed with Cephalosporin use.     Patient Active Problem List   Diagnosis Date Noted  . Cervical disc disorder with radiculopathy of cervical region 12/01/2015  . Tobacco user 07/18/2013  . Obesity, unspecified 07/18/2013  . Seasonal allergies 07/18/2013  . S/P cervical spinal fusion 02/27/2012    Past Medical History:  Diagnosis Date  . Allergy   . Arthritis     Past Surgical History:  Procedure Laterality Date  . SPINAL CORD DECOMPRESSION N/A 09/16/2010   Cervical spine- Dr. Yevette Edwards  . SPINAL FUSION N/A 09/16/2010   Cervical spine- Dr. Yevette Edwards    Social History   Social History  . Marital status: Single    Spouse name: N/A  . Number of children: N/A  . Years of education: N/A   Occupational History  . Not on file.   Social History Main Topics  . Smoking status: Former Smoker    Packs/day: 0.25    Years: 33.00    Types: Cigarettes  . Smokeless tobacco: Never Used  .  Alcohol use Yes  . Drug use: No  . Sexual activity: Yes   Other Topics Concern  . Not on file   Social History Narrative  . No narrative on file    Family History  Problem Relation Age of Onset  . Cancer Brother   . Breast cancer Maternal Aunt   . Breast cancer Maternal Grandmother      Review of Systems  Constitutional: Negative for chills, fever and weight loss.  HENT: Negative for ear discharge, hearing loss, sinus pain and sore throat.   Eyes: Negative for blurred vision and double vision.  Respiratory: Negative for cough, shortness of breath and wheezing.     Cardiovascular: Negative for chest pain, palpitations, claudication and leg swelling.  Gastrointestinal: Negative for abdominal pain, diarrhea, nausea and vomiting.  Genitourinary: Negative for dysuria and hematuria.  Musculoskeletal: Positive for joint pain (chronic joint pains). Negative for myalgias.       Left foot intermittent pain x months, ?FB  Skin: Negative.  Negative for rash.  Neurological: Positive for weakness and headaches (intermittent). Negative for dizziness, sensory change and focal weakness.  Endo/Heme/Allergies: Negative.   All other systems reviewed and are negative.  EKG: NSR; no acute ischemic changes. Vitals:   05/05/16 1407  BP: 138/86  Pulse: 74  Resp: 18  Temp: 98.3 F (36.8 C)    Physical Exam  Constitutional: She is oriented to person, place, and time. She appears well-developed and well-nourished.  HENT:  Head: Normocephalic and atraumatic.  Nose: Nose normal.  Mouth/Throat: Oropharynx is clear and moist. No oropharyngeal exudate.  Eyes: Conjunctivae and EOM are normal. Pupils are equal, round, and reactive to light.  Neck: Normal range of motion. Neck supple. No JVD present. No thyromegaly present.  Cardiovascular: Normal rate, regular rhythm, normal heart sounds and intact distal pulses.   Pulmonary/Chest: Effort normal and breath sounds normal.  Abdominal: Soft. Bowel sounds are normal. She exhibits no distension. There is no tenderness.  Musculoskeletal: Normal range of motion.  Left foot: NVI, plantar surface shows small area of hardened/callused round skin, ?FB reaction, no erythema or tenderness, no visible FB  Lymphadenopathy:    She has no cervical adenopathy.  Neurological: She is alert and oriented to person, place, and time. No sensory deficit. She exhibits normal muscle tone. Coordination normal.  Skin: Skin is warm and dry. Capillary refill takes less than 2 seconds.  Psychiatric: She has a normal mood and affect. Her behavior is  normal.  Vitals reviewed.    ASSESSMENT & PLAN: [name] was seen today for hypertension and back pain.  Diagnoses and all orders for this visit:  Essential hypertension -     CBC with Differential/Platelet -     Comprehensive metabolic panel -     TSH -     EKG 12-Lead -     Ambulatory referral to Rheumatology  Chronic arthralgias of knees and hips  Pain in joints  Pain of joint of left ankle and foot Comments: suspected old FB (with FB reaction) Orders: -     Ambulatory referral to Podiatry  Other orders -     amLODipine (NORVASC) 5 MG tablet; Take 1 tablet (5 mg total) by mouth daily.      Patient Instructions       IF you received an x-ray today, you will receive an invoice from [facility]. Please contact [facility] at [phone] with questions or concerns regarding your invoice.   IF you received labwork today, you  will receive an invoice from [place]. Please contact [facility] at [phone] with questions or concerns regarding your invoice.   Our billing staff will not be able to assist you with questions regarding bills from these companies.  You will be contacted with the lab results as soon as they are available. The fastest way to get your results is to activate your My Chart account. Instructions are located on the last page of this paperwork. If you have not heard from [place] regarding the results in 2 weeks, please contact this office.      Hypertension Hypertension is another name for high blood pressure. High blood pressure forces your heart to work harder to pump blood. This can cause problems over time. There are two numbers in a blood pressure reading. There is a top number (systolic) over a bottom number (diastolic). It is best to have a blood pressure below 120/80. Healthy choices can help lower your blood pressure. You may need medicine to help lower your blood pressure if:  Your blood pressure cannot be lowered with  healthy choices.  Your blood pressure is higher than 130/80. Follow these instructions at home: Eating and drinking   If directed, follow the DASH eating plan. This diet includes:  Filling half of your plate at each meal with fruits and vegetables.  Filling one quarter of your plate at each meal with whole grains. Whole grains include whole wheat pasta, brown rice, and whole grain bread.  Eating or drinking low-fat dairy products, such as skim milk or low-fat yogurt.  Filling one quarter of your plate at each meal with low-fat (lean) proteins. Low-fat proteins include fish, skinless chicken, eggs, beans, and tofu.  Avoiding fatty meat, cured and processed meat, or chicken with skin.  Avoiding premade or processed food.  Eat less than 1,500 mg of salt (sodium) a day.  Limit alcohol use to no more than 1 drink a day for nonpregnant women and 2 drinks a day for men. One drink equals 12 oz of beer, 5 oz of wine, or 1 oz of hard liquor. Lifestyle   Work with your doctor to stay at a healthy weight or to lose weight. Ask your doctor what the best weight is for you.  Get at least 30 minutes of exercise that causes your heart to beat faster (aerobic exercise) most days of the week. This may include walking, swimming, or biking.  Get at least 30 minutes of exercise that strengthens your muscles (resistance exercise) at least 3 days a week. This may include lifting weights or pilates.  Do not use any products that contain nicotine or tobacco. This includes cigarettes and e-cigarettes. If you need help quitting, ask your doctor.  Check your blood pressure at home as told by your doctor.  Keep all follow-up visits as told by your doctor. This is important. Medicines   Take over-the-counter and prescription medicines only as told by your doctor. Follow directions carefully.  Do not skip doses of blood pressure medicine. The medicine does not work as well if you skip doses. Skipping doses  also puts you at risk for problems.  Ask your doctor about side effects or reactions to medicines that you should watch for. Contact a doctor if:  You think you are having a reaction to the medicine you are taking.  You have headaches that keep coming back (recurring).  You feel dizzy.  You have swelling in your ankles.  You have trouble with your vision. Get help right  away if:  You get a very bad headache.  You start to feel confused.  You feel weak or numb.  You feel faint.  You get very bad pain in your:  Chest.  Belly (abdomen).  You throw up (vomit) more than once.  You have trouble breathing. Summary  Hypertension is another name for high blood pressure.  Making healthy choices can help lower blood pressure. If your blood pressure cannot be controlled with healthy choices, you may need to take medicine. This information is not intended to replace advice given to you by your health care provider. Make sure you discuss any questions you have with your health care provider. Document Released: 06/29/2007 Document Revised: 12/09/2015 Document Reviewed: 12/09/2015 Elsevier Interactive Patient Education  2017 Elsevier Inc.    Edwina Barth, MD [facility]

## 2016-05-06 LAB — COMPREHENSIVE METABOLIC PANEL
A/G RATIO: 1.7 (ref 1.2–2.2)
ALK PHOS: 65 IU/L (ref 39–117)
ALT: 22 IU/L (ref 0–32)
AST: 17 IU/L (ref 0–40)
Albumin: 4.1 g/dL (ref 3.5–5.5)
BILIRUBIN TOTAL: 0.3 mg/dL (ref 0.0–1.2)
BUN/Creatinine Ratio: 12 (ref 9–23)
BUN: 11 mg/dL (ref 6–24)
CHLORIDE: 102 mmol/L (ref 96–106)
CO2: 27 mmol/L (ref 18–29)
Calcium: 9 mg/dL (ref 8.7–10.2)
Creatinine, Ser: 0.94 mg/dL (ref 0.57–1.00)
GFR calc non Af Amer: 70 mL/min/{1.73_m2} (ref >59)
GFR, EST AFRICAN AMERICAN: 81 mL/min/{1.73_m2} (ref >59)
Globulin, Total: 2.4 g/dL (ref 1.5–4.5)
Glucose: 96 mg/dL (ref 65–99)
POTASSIUM: 4.2 mmol/L (ref 3.5–5.2)
Sodium: 139 mmol/L (ref 134–144)
TOTAL PROTEIN: 6.5 g/dL (ref 6.0–8.5)

## 2016-05-06 LAB — CBC WITH DIFFERENTIAL/PLATELET
BASOS ABS: 0 10*3/uL (ref 0.0–0.2)
BASOS: 0 %
EOS (ABSOLUTE): 0.1 10*3/uL (ref 0.0–0.4)
Eos: 1 %
Hematocrit: 45.2 % (ref 34.0–46.6)
Hemoglobin: 14.8 g/dL (ref 11.1–15.9)
IMMATURE GRANS (ABS): 0 10*3/uL (ref 0.0–0.1)
Immature Granulocytes: 0 %
LYMPHS ABS: 3.6 10*3/uL — AB (ref 0.7–3.1)
Lymphs: 40 %
MCH: 29.3 pg (ref 26.6–33.0)
MCHC: 32.7 g/dL (ref 31.5–35.7)
MCV: 90 fL (ref 79–97)
MONOS ABS: 0.7 10*3/uL (ref 0.1–0.9)
Monocytes: 8 %
NEUTROS ABS: 4.6 10*3/uL (ref 1.4–7.0)
Neutrophils: 51 %
PLATELETS: 282 10*3/uL (ref 150–379)
RBC: 5.05 x10E6/uL (ref 3.77–5.28)
RDW: 15 % (ref 12.3–15.4)
WBC: 9 10*3/uL (ref 3.4–10.8)

## 2016-05-06 LAB — TSH: TSH: 0.911 u[IU]/mL (ref 0.450–4.500)

## 2016-05-19 ENCOUNTER — Telehealth: Payer: Self-pay | Admitting: Emergency Medicine

## 2016-05-19 NOTE — Telephone Encounter (Signed)
Pt called to see if [place] could write a letter, for HR purposes, stating that the pt cannot travel for more than 30 to 45 minutes due to her severe back pain.  Pt states that recently, her employer has been asking her to drive outside of the state to go to meetings but because of the prolonged sitting, she has extreme back pain and has to stop every 30 minutes or so to relieve the pain by getting out of the vehicle.  She is frustrated because nobody knows what is going on with her condition and she cannot afford to keep going to specialists and get no answers.  She also wants to know if this is a chronic condition rather than an acute one.  Please advise [phone]

## 2016-05-20 NOTE — Telephone Encounter (Signed)
I am but not sure if this condition has a chronic component. Needs to also see specialist to answer that question.

## 2016-05-20 NOTE — Telephone Encounter (Signed)
Last seen 05/05/16 Are you willing to write?

## 2016-05-23 ENCOUNTER — Ambulatory Visit (INDEPENDENT_AMBULATORY_CARE_PROVIDER_SITE_OTHER): Payer: BC Managed Care – PPO | Admitting: Family Medicine

## 2016-05-23 ENCOUNTER — Encounter: Payer: Self-pay | Admitting: Family Medicine

## 2016-05-23 VITALS — BP 123/85 | HR 87 | Temp 98.6°F | Resp 17 | Ht 65.0 in | Wt 206.0 lb

## 2016-05-23 DIAGNOSIS — G894 Chronic pain syndrome: Secondary | ICD-10-CM | POA: Diagnosis not present

## 2016-05-23 DIAGNOSIS — E669 Obesity, unspecified: Secondary | ICD-10-CM

## 2016-05-23 DIAGNOSIS — M4690 Unspecified inflammatory spondylopathy, site unspecified: Secondary | ICD-10-CM | POA: Diagnosis not present

## 2016-05-23 DIAGNOSIS — F32A Depression, unspecified: Secondary | ICD-10-CM

## 2016-05-23 DIAGNOSIS — F329 Major depressive disorder, single episode, unspecified: Secondary | ICD-10-CM

## 2016-05-23 DIAGNOSIS — M5010 Cervical disc disorder with radiculopathy, unspecified cervical region: Secondary | ICD-10-CM

## 2016-05-23 DIAGNOSIS — M47819 Spondylosis without myelopathy or radiculopathy, site unspecified: Secondary | ICD-10-CM

## 2016-05-23 MED ORDER — GABAPENTIN 300 MG PO CAPS: 300.0000 mg | ORAL_CAPSULE | Freq: Three times a day (TID) | ORAL | 1 refills | Status: DC

## 2016-05-23 NOTE — Patient Instructions (Addendum)
Take gabapentin 1 pill at bedtime for 3 days, then 1 twice daily for 3 days, then one 3 times daily on a consistent basis.   After 4-6 weeks return with getting an appointment with Trena Platt PA-C to follow-up on this. It is my plan that if you're not feeling like the pain level has come down reasonably, that she will be able to gradually increase you on the gabapentin.  Take acetaminophen (Tylenol) 650 mg 2 pills at breakfast and 2 at supper. The maximum amount of acetaminophen you should take in a 24-hour time span is 3000 mg.  If you need additional pain relief, you can take over-the-counter ibuprofen 200 mg 3 pills 2 or 3 times daily. However this should not be done on a daily basis because it can irritate your stomach and cause kidney problems.  Continue to try and do frequent short episodes of activity.  Avoid driving or riding in a car for long distances greater than about 30 minutes.  If acutely worse come in sooner, but do make it a priority to follow-up on this.  I think that at your return visit it may be appropriate to add an antidepressant if you are still feeling pretty down about how things are going.  If necessary you can be referred back to a back physician or chronic pain clinic. Keep your appointment with your arthritis doctor and see if they have any other suggestions, and you can show them my discussion of this.  As we discussed, try to eat less because if you can lose a little weight, even 5 or 10 pounds may make a difference in how you feel.    IF you received an x-ray today, you will receive an invoice from [facility]. Please contact [facility] at [phone] with questions or concerns regarding your invoice.   IF you received labwork today, you will receive an invoice from [facility]. Please contact [facility] at [phone] with questions or concerns regarding your invoice.   Our billing staff will not be able to assist you with questions  regarding bills from these companies.  You will be contacted with the lab results as soon as they are available. The fastest way to get your results is to activate your My Chart account. Instructions are located on the last page of this paperwork. If you have not heard from [place] regarding the results in 2 weeks, please contact this office.

## 2016-05-23 NOTE — Progress Notes (Signed)
Patient ID: [name], female    DOB: Mar 14, 1965  Age: 51 y.o. MRN: [id number]  Chief Complaint  Patient presents with  . Back Pain    on going, chronic/ has had spinal surgery, needs to know if there is anything anyone can do for her.    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 She has had injections in the past. She has had cervical surgery. These problems have been going on for much of her life, and we have records of a scan of her back from 2001 showing the problems that are similar to what showed on a scan last year. She hurts all the time. She works a Office manager. She can no longer even drive the hour and 15 minutes to where she has always gone for church. She took 30 minute drive last weekend had the pain tighten her up so much she wasn't sure she could break the car. She is hardly gotten a good chair and foot rest at work, but still has the pains. She is discouraged. She was placed on gabapentin several months ago, did not get a refill or follow-up after treatment. She is here with her niece today.  Current allergies, medications, problem list, past/family and social histories reviewed.  Objective:  BP 123/85 (BP Location: Right Arm, Patient Position: Sitting, Cuff Size: Large)   Pulse 87   Temp 98.6 F (37 C) (Oral)   Resp 17   Ht  (1.651 m)   Wt 206 lb (93.4 kg)   LMP 05/11/2016   SpO2 99%   BMI 34.28 kg/m   She has tight tender in the neck, shoulders, and lumbar spine region. She has very limited range of motion. She is significantly overweight. She says she has gained some since quit smoking. Deep tendon reflexes are 1+ and symmetrical.  Assessment & Plan:   Assessment: 1. Facet arthropathy of spine (HCC)   2. Cervical disc disorder with radiculopathy of cervical region   3. Chronic pain syndrome   4. Depression, unspecified depression type   5. Obesity without serious comorbidity, unspecified classification,  unspecified obesity type       Plan: See instructions. Goal is to get her on the Neurontin and then add an antidepressant such as Paxil. Urged her to do what she can to gradually with frequent short distances increase her exercise and activity.  No orders of the defined types were placed in this encounter.   No orders of the defined types were placed in this encounter.        Patient Instructions   Take gabapentin 1 pill at bedtime for 3 days, then 1 twice daily for 3 days, then one 3 times daily on a consistent basis.   After 4-6 weeks return with getting an appointment with Trena Platt PA-C to follow-up on this. It is my plan that if you're not feeling like the pain level has come down reasonably, that she will be able to gradually increase you on the gabapentin.  Take acetaminophen (Tylenol) 650 mg 2 pills at breakfast and 2 at supper. The maximum amount of acetaminophen you should take in a 24-hour time span is 3000 mg.  If you need additional pain relief, you can take over-the-counter ibuprofen 200 mg 3 pills 2 or 3 times daily. However this should not be done on a daily basis because it can irritate your stomach and cause kidney problems.  Continue to try and do frequent  short episodes of activity.  Avoid driving or riding in a car for long distances greater than about 30 minutes.  If acutely worse come in sooner, but do make it a priority to follow-up on this.  I think that at your return visit it may be appropriate to add an antidepressant if you are still feeling pretty down about how things are going.  If necessary you can be referred back to a back physician or chronic pain clinic. Keep your appointment with your arthritis doctor and see if they have any other suggestions, and you can show them my discussion of this.  As we discussed, try to eat less because if you can lose a little weight, even 5 or 10 pounds may make a difference in how you feel.    IF  you received an x-ray today, you will receive an invoice from [facility]. Please contact [facility] at [phone] with questions or concerns regarding your invoice.   IF you received labwork today, you will receive an invoice from [place]. Please contact [facility] at [phone] with questions or concerns regarding your invoice.   Our billing staff will not be able to assist you with questions regarding bills from these companies.  You will be contacted with the lab results as soon as they are available. The fastest way to get your results is to activate your My Chart account. Instructions are located on the last page of this paperwork. If you have not heard from [place] regarding the results in 2 weeks, please contact this office.        No Follow-up on file.   Aleisa Howk, MD 05/23/2016

## 2016-05-24 NOTE — Telephone Encounter (Signed)
l/m with dr Darlin Drop note, saw hopper yesterday

## 2016-05-27 ENCOUNTER — Ambulatory Visit (INDEPENDENT_AMBULATORY_CARE_PROVIDER_SITE_OTHER): Payer: BC Managed Care – PPO | Admitting: Podiatry

## 2016-05-27 ENCOUNTER — Encounter: Payer: Self-pay | Admitting: Podiatry

## 2016-05-27 ENCOUNTER — Ambulatory Visit: Payer: BC Managed Care – PPO

## 2016-05-27 VITALS — BP 124/82 | HR 84 | Resp 16 | Ht 65.0 in | Wt 202.0 lb

## 2016-05-27 DIAGNOSIS — M25571 Pain in right ankle and joints of right foot: Secondary | ICD-10-CM

## 2016-05-27 DIAGNOSIS — Q828 Other specified congenital malformations of skin: Secondary | ICD-10-CM

## 2016-05-27 DIAGNOSIS — M779 Enthesopathy, unspecified: Secondary | ICD-10-CM | POA: Diagnosis not present

## 2016-05-27 MED ORDER — TRIAMCINOLONE ACETONIDE 10 MG/ML IJ SUSP
10.0000 mg | Freq: Once | INTRAMUSCULAR | Status: AC
  Administered 2016-05-27: 10 mg

## 2016-05-27 NOTE — Progress Notes (Signed)
   Subjective:    Patient ID: [name], female    DOB: [birth year], 51 y.o.   MRN: [id number]  HPI Chief Complaint  Patient presents with  . Painful lesion    Left foot; midfoot; x5+ months     Review of Systems  All other systems reviewed and are negative.      Objective:   Physical Exam        Assessment & Plan:

## 2016-05-28 NOTE — Progress Notes (Signed)
Subjective:    Patient ID: [name], female   DOB: [birth year]50 y.o.   MRN: [id number]   HPI patient presents stating she's getting a lot of pain in the outside of her left foot and it's occurring over the last several months and making it hard to walk with no history of injury    Review of Systems  All other systems reviewed and are negative.       Objective:  Physical Exam  Cardiovascular: Intact distal pulses.   Musculoskeletal: Normal range of motion.  Neurological: She is alert.  Skin: Skin is warm.  Nursing note and vitals reviewed.  neurovascular status was found to be intact muscle strength was adequate with patient found to have inflammatory changes of the base of the fifth metatarsal left with fluid buildup and overlying lesion formation that's painful when pressed. Patient's found have good digital perfusion well oriented     Assessment:    Inflammatory changes with probability for fifth MPJ plantar capsulitis and lesion formation     Plan:   H&P conditions reviewed at great length and sterile prep and then injection administered 3 Milligan Kenalog 5 mill grams Xylocaine and then I went ahead and debrided the lesion and applied padding with medication. Applied sterile dressing and reappoint her recheck

## 2016-06-09 ENCOUNTER — Telehealth: Payer: Self-pay | Admitting: Physician Assistant

## 2016-06-09 DIAGNOSIS — M47819 Spondylosis without myelopathy or radiculopathy, site unspecified: Secondary | ICD-10-CM

## 2016-06-09 NOTE — Telephone Encounter (Signed)
Pt wanted to call and let you know that the gabpentin is not working.  Pt contact number [phone]

## 2016-06-09 NOTE — Telephone Encounter (Signed)
Using 300mg  tid  On it x 2 weeks   No relief at all, back and leg pain "like a duck"

## 2016-06-10 MED ORDER — GABAPENTIN 300 MG PO CAPS: 600.0000 mg | ORAL_CAPSULE | Freq: Three times a day (TID) | ORAL | 1 refills | Status: DC

## 2016-06-10 NOTE — Telephone Encounter (Signed)
I did not see this patient, but I will be following up with her.  She can try taking 600mg  three times per day, if she has been taking it 300mg  three times per day.  Follow up with me if no improvement of the pain in 1 week.  If improvement, follow up with me in the time Dr. Alwyn RenHopper suggested.

## 2016-07-17 ENCOUNTER — Encounter (HOSPITAL_COMMUNITY): Payer: Self-pay | Admitting: Emergency Medicine

## 2016-07-17 ENCOUNTER — Emergency Department (HOSPITAL_COMMUNITY)
Admission: EM | Admit: 2016-07-17 | Discharge: 2016-07-17 | Disposition: A | Discharge status: Home / Self Care | Payer: BC Managed Care – PPO | Attending: Emergency Medicine | Admitting: Emergency Medicine

## 2016-07-17 ENCOUNTER — Emergency Department (HOSPITAL_COMMUNITY): Payer: BC Managed Care – PPO

## 2016-07-17 DIAGNOSIS — R0789 Other chest pain: Secondary | ICD-10-CM | POA: Insufficient documentation

## 2016-07-17 DIAGNOSIS — I10 Essential (primary) hypertension: Secondary | ICD-10-CM | POA: Insufficient documentation

## 2016-07-17 DIAGNOSIS — Z87891 Personal history of nicotine dependence: Secondary | ICD-10-CM | POA: Insufficient documentation

## 2016-07-17 DIAGNOSIS — Z79899 Other long term (current) drug therapy: Secondary | ICD-10-CM | POA: Insufficient documentation

## 2016-07-17 DIAGNOSIS — R079 Chest pain, unspecified: Secondary | ICD-10-CM | POA: Diagnosis present

## 2016-07-17 LAB — CBC
HCT: 45.7 % (ref 36.0–46.0)
HEMOGLOBIN: 15 g/dL (ref 12.0–15.0)
MCH: 29.3 pg (ref 26.0–34.0)
MCHC: 32.8 g/dL (ref 30.0–36.0)
MCV: 89.3 fL (ref 78.0–100.0)
PLATELETS: 246 10*3/uL (ref 150–400)
RBC: 5.12 MIL/uL — AB (ref 3.87–5.11)
RDW: 15.2 % (ref 11.5–15.5)
WBC: 13 10*3/uL — AB (ref 4.0–10.5)

## 2016-07-17 LAB — BASIC METABOLIC PANEL
ANION GAP: 9 (ref 5–15)
BUN: 15 mg/dL (ref 6–20)
CHLORIDE: 104 mmol/L (ref 101–111)
CO2: 25 mmol/L (ref 22–32)
Calcium: 8.8 mg/dL — ABNORMAL LOW (ref 8.9–10.3)
Creatinine, Ser: 1.08 mg/dL — ABNORMAL HIGH (ref 0.44–1.00)
GFR calc non Af Amer: 58 mL/min — ABNORMAL LOW (ref >60)
Glucose, Bld: 115 mg/dL — ABNORMAL HIGH (ref 65–99)
Potassium: 3.8 mmol/L (ref 3.5–5.1)
SODIUM: 138 mmol/L (ref 135–145)

## 2016-07-17 LAB — I-STAT TROPONIN, ED: Troponin i, poc: 0 ng/mL (ref 0.00–0.08)

## 2016-07-17 MED ORDER — HYDROMORPHONE HCL 1 MG/ML IJ SOLN
1.0000 mg | Freq: Once | INTRAMUSCULAR | Status: AC
  Administered 2016-07-17: 1 mg via INTRAVENOUS
  Filled 2016-07-17: qty 1

## 2016-07-17 MED ORDER — HYDROCODONE-ACETAMINOPHEN 5-325 MG PO TABS: 1.0000 | ORAL_TABLET | Freq: Four times a day (QID) | ORAL | 0 refills | Status: DC | PRN

## 2016-07-17 MED ORDER — SODIUM CHLORIDE 0.9 % IV SOLN
INTRAVENOUS | Status: DC
  Administered 2016-07-17: 09:00:00 via INTRAVENOUS

## 2016-07-17 MED ORDER — LORAZEPAM 2 MG/ML IJ SOLN
1.0000 mg | Freq: Once | INTRAMUSCULAR | Status: AC
  Administered 2016-07-17: 1 mg via INTRAVENOUS
  Filled 2016-07-17: qty 1

## 2016-07-17 MED ORDER — ONDANSETRON HCL 4 MG/2ML IJ SOLN
4.0000 mg | Freq: Once | INTRAMUSCULAR | Status: AC
  Administered 2016-07-17: 4 mg via INTRAVENOUS
  Filled 2016-07-17: qty 2

## 2016-07-17 NOTE — ED Provider Notes (Signed)
[facility] DEPT Provider Note   CSN: [id number] Arrival date & time: 07/17/16  0440     History   Chief Complaint Chief Complaint  Patient presents with  . Chest Pain    rib cage and shoulder blade pain    HPI [name] is a 51 y.o. female.  Patient with a complaint of pain around her left shoulder blade area for one week but got worse 3 in the morning. 10 out of 10. Started to radiate around to the front part of the chest as well. Patient without any shortness of breath. Patient feels that it's her nerve type pain she's had in the past. Not associated with shortness of breath. Is made worse by low bit of movement. No nausea no vomiting no anterior abdominal pain.      Past Medical History:  Diagnosis Date  . Allergy   . Arthritis     Patient Active Problem List   Diagnosis Date Noted  . Essential hypertension 05/05/2016  . Chronic arthralgias of knees and hips 05/05/2016  . Cervical disc disorder with radiculopathy of cervical region 12/01/2015  . Tobacco user 07/18/2013  . Obesity, unspecified 07/18/2013  . Seasonal allergies 07/18/2013  . S/P cervical spinal fusion 02/27/2012    Past Surgical History:  Procedure Laterality Date  . SPINAL CORD DECOMPRESSION N/A 09/16/2010   Cervical spine- Dr. Yevette Edwards  . SPINAL FUSION N/A 09/16/2010   Cervical spine- Dr. Yevette Edwards    OB History    No data available       Home Medications    Prior to Admission medications   Medication Sig Start Date End Date Taking? Authorizing Provider  acetaminophen (TYLENOL) 650 MG CR tablet Take 650 mg by mouth every 8 (eight) hours as needed for pain.   Yes [provider]  amLODipine (NORVASC) 5 MG tablet Take 1 tablet (5 mg total) by mouth daily. 05/05/16  Yes Sagardia, Eilleen Kempf, MD  cetirizine (ZYRTEC) 10 MG tablet Take 1 tablet (10 mg total) by mouth daily. 07/21/15  Yes Wallis Bamberg, PA-C  gabapentin (NEURONTIN) 300 MG capsule Take 2 capsules (600 mg total)  by mouth 3 (three) times daily. Start with 1 tablet at night 3 days.  Then 1 tablet twice per day for 3 days.  Then 1 tablet three times per day. Patient taking differently: Take 600 mg by mouth 3 (three) times daily.  06/10/16  Yes Trena Platt D, PA  levonorgestrel (MIRENA) 20 MCG/24HR IUD 1 each by Intrauterine route once.   Yes [provider]  Menthol, Topical Analgesic, (BENGAY EX) Apply 1 application topically daily as needed (pain).   Yes [provider]  naproxen sodium (ANAPROX) 220 MG tablet Take 220 mg by mouth daily as needed (pain).   Yes [provider]  HYDROcodone-acetaminophen (NORCO/VICODIN) 5-325 MG tablet Take 1-2 tablets by mouth every 6 (six) hours as needed for moderate pain. 07/17/16   Vanetta Mulders, MD    Family History Family History  Problem Relation Age of Onset  . Cancer Brother   . Breast cancer Maternal Aunt   . Breast cancer Maternal Grandmother     Social History Social History  Substance Use Topics  . Smoking status: Former Smoker    Packs/day: 0.25    Years: 33.00    Types: Cigarettes  . Smokeless tobacco: Never Used  . Alcohol use Yes     Allergies   Azithromycin; Claritin [loratadine]; and Penicillins   Review of Systems Review of  Systems  HENT: Negative for congestion.   Eyes: Negative for redness.  Cardiovascular: Positive for chest pain.  Gastrointestinal: Negative for abdominal pain, nausea and vomiting.  Genitourinary: Negative for dysuria.  Musculoskeletal: Positive for back pain.  Skin: Negative for rash.  Neurological: Negative for syncope, weakness, numbness and headaches.  Hematological: Does not bruise/bleed easily.  Psychiatric/Behavioral: Negative for confusion.     Physical Exam Updated Vital Signs BP (!) 144/86 (BP Location: Right Arm)   Pulse 73   Temp 98.2 F (36.8 C) (Oral)   Resp 15   Ht 1.651 m (5\' 5" )   Wt 90.7 kg (200 lb)   LMP 06/14/2016   SpO2 99%   BMI 33.28 kg/m    Physical Exam  Constitutional: She is oriented to person, place, and time. She appears well-developed and well-nourished. No distress.  HENT:  Head: Normocephalic and atraumatic.  Mouth/Throat: Oropharynx is clear and moist.  Eyes: Conjunctivae and EOM are normal. Pupils are equal, round, and reactive to light.  Neck: Normal range of motion. Neck supple.  Cardiovascular: Normal rate, regular rhythm and normal heart sounds.   Pulmonary/Chest: Effort normal and breath sounds normal. No respiratory distress. She exhibits tenderness.  Abdominal: Soft. Bowel sounds are normal. There is no tenderness.  Musculoskeletal: Normal range of motion.  Neurological: She is alert and oriented to person, place, and time. No cranial nerve deficit or sensory deficit. She exhibits normal muscle tone. Coordination normal.  Skin: Skin is warm. No rash noted.  Nursing note and vitals reviewed.    ED Treatments / Results  Labs (all labs ordered are listed, but only abnormal results are displayed) Labs Reviewed  BASIC METABOLIC PANEL - Abnormal; Notable for the following:       Result Value   Glucose, Bld 115 (*)    Creatinine, Ser 1.08 (*)    Calcium 8.8 (*)    GFR calc non Af Amer 58 (*)    All other components within normal limits  CBC - Abnormal; Notable for the following:    WBC 13.0 (*)    RBC 5.12 (*)    All other components within normal limits  I-STAT TROPOININ, ED    EKG  EKG Interpretation  Date/Time:  Sunday July 17 2016 04:45:01 EDT Ventricular Rate:  89 PR Interval:  128 QRS Duration: 74 QT Interval:  374 QTC Calculation: 455 R Axis:   76 Text Interpretation:  Normal sinus rhythm Normal ECG Confirmed by Vanetta MuldersZackowski, Vahe Pienta [phone]([id number]) on 07/17/2016 8:35:39 AM       Radiology Dg Chest 2 View  Result Date: 07/17/2016 CLINICAL DATA:  RIGHT rib pain radiating to scapula for few days. EXAM: CHEST  2 VIEW COMPARISON:  Chest radiograph October 29, 2014 FINDINGS: Cardiomediastinal  silhouette is normal. No pleural effusions or focal consolidations. Mild bronchitic changes. Trachea projects midline and there is no pneumothorax. Soft tissue planes and included osseous structures are non-suspicious. ACDF. Mild degenerative change of thoracic spine. IMPRESSION: Mild bronchitic changes. Electronically Signed   By: Awilda Metroourtnay  Bloomer M.D.   On: 07/17/2016 05:30    Procedures Procedures (including critical care time)  Medications Ordered in ED Medications  0.9 %  sodium chloride infusion ( Intravenous New Bag/Given 07/17/16 0902)  ondansetron (ZOFRAN) injection 4 mg (4 mg Intravenous Given 07/17/16 0903)  HYDROmorphone (DILAUDID) injection 1 mg (1 mg Intravenous Given 07/17/16 0903)  LORazepam (ATIVAN) injection 1 mg (1 mg Intravenous Given 07/17/16 0904)     Initial Impression / Assessment and Plan /  ED Course  I have reviewed the triage vital signs and the nursing notes.  Pertinent labs & imaging results that were available during my care of the patient were reviewed by me and considered in my medical decision making (see chart for details).     Patient symptoms consistent with the chest wall pain. Main pain is on the right side posterior chest around the shoulder blade area. No abdominal pain. Pain does radiate around some to the anterior part of the chest. This is been ongoing for about a week. Got worse recently. Cardiac workup here without any acute findings. Feel that this is truly musculoskeletal. Made worse by some movements. Improve significantly with pain medicine. Will continue outpatient hydrocodone have her follow-up with primary care provider.  Also painful to palpation. Also not concerned about the pulmonary was. Oxygen saturation saturations are 100% on room air. Patient not tachycardic.  Final Clinical Impressions(s) / ED Diagnoses   Final diagnoses:  Chest wall pain    New Prescriptions New Prescriptions   HYDROCODONE-ACETAMINOPHEN (NORCO/VICODIN) 5-325  MG TABLET    Take 1-2 tablets by mouth every 6 (six) hours as needed for moderate pain.     Vanetta Mulders, MD 07/17/16 1031

## 2016-07-17 NOTE — Discharge Instructions (Signed)
Take pain medicine as directed. Make an appointment to follow-up with your regular doctor. Return for any new or worse symptoms. Work note provided.

## 2016-07-17 NOTE — ED Triage Notes (Signed)
Pt c/o right rib cage pain radiating to her right shoulder blade 10/10 with SOB, pt is very anxious on triage and crying unable to stay still. SPO2 100% on RA.

## 2016-07-17 NOTE — ED Notes (Signed)
md at bedside

## 2016-07-18 ENCOUNTER — Encounter (HOSPITAL_COMMUNITY): Payer: Self-pay | Admitting: *Deleted

## 2016-07-18 ENCOUNTER — Emergency Department (HOSPITAL_COMMUNITY)
Admission: EM | Admit: 2016-07-18 | Discharge: 2016-07-18 | Disposition: A | Discharge status: Home / Self Care | Payer: BC Managed Care – PPO | Attending: Emergency Medicine | Admitting: Emergency Medicine

## 2016-07-18 DIAGNOSIS — M546 Pain in thoracic spine: Secondary | ICD-10-CM

## 2016-07-18 DIAGNOSIS — M62838 Other muscle spasm: Secondary | ICD-10-CM

## 2016-07-18 DIAGNOSIS — M5410 Radiculopathy, site unspecified: Secondary | ICD-10-CM

## 2016-07-18 DIAGNOSIS — M5414 Radiculopathy, thoracic region: Secondary | ICD-10-CM | POA: Diagnosis not present

## 2016-07-18 MED ORDER — KETOROLAC TROMETHAMINE 60 MG/2ML IM SOLN
60.0000 mg | Freq: Once | INTRAMUSCULAR | Status: AC
  Administered 2016-07-18: 60 mg via INTRAMUSCULAR
  Filled 2016-07-18: qty 2

## 2016-07-18 MED ORDER — METHOCARBAMOL 500 MG PO TABS: 1000.0000 mg | ORAL_TABLET | Freq: Four times a day (QID) | ORAL | 0 refills | Status: DC

## 2016-07-18 MED ORDER — HYDROMORPHONE HCL 1 MG/ML IJ SOLN
1.0000 mg | Freq: Once | INTRAMUSCULAR | Status: AC
  Administered 2016-07-18: 1 mg via INTRAMUSCULAR
  Filled 2016-07-18: qty 1

## 2016-07-18 MED ORDER — ONDANSETRON 4 MG PO TBDP
4.0000 mg | ORAL_TABLET | Freq: Once | ORAL | Status: AC
  Administered 2016-07-18: 4 mg via ORAL
  Filled 2016-07-18: qty 1

## 2016-07-18 MED ORDER — PREDNISONE 20 MG PO TABS: ORAL_TABLET | ORAL | 0 refills | Status: DC

## 2016-07-18 NOTE — Discharge Instructions (Signed)
Please read and follow all provided instructions.  Your diagnoses today include:  1. Acute right-sided thoracic back pain   2. Radicular pain   3. Muscle spasm    Tests performed today include:  Vital signs - see below for your results today  Medications prescribed:   Prednisone - steroid medicine   It is best to take this medication in the morning to prevent sleeping problems. If you are diabetic, monitor your blood sugar closely and stop taking Prednisone if blood sugar is over 300. Take with food to prevent stomach upset.    Robaxin (methocarbamol) - muscle relaxer medication  DO NOT drive or perform any activities that require you to be awake and alert because this medicine can make you drowsy.   Take any prescribed medications only as directed.  Home care instructions:   Follow any educational materials contained in this packet  Please rest, use ice or heat on your back for the next several days  Do not lift, push, pull anything more than 10 pounds for the next week  Follow-up instructions: Please follow-up with your primary care provider in the next 3 days for further evaluation of your symptoms.   Return instructions:  SEEK IMMEDIATE MEDICAL ATTENTION IF YOU HAVE:  New numbness, tingling, weakness, or problem with the use of your arms or legs  Severe back pain not relieved with medications  Loss control of your bowels or bladder  Increasing pain in any areas of the body (such as chest or abdominal pain)  Shortness of breath, dizziness, or fainting.   Worsening nausea (feeling sick to your stomach), vomiting, fever, or sweats  Any other emergent concerns regarding your health   Additional Information:  Your vital signs today were: BP 124/85    Pulse 62    Temp 98.4 F (36.9 C) (Oral)    Resp 16    SpO2 97%  If your blood pressure (BP) was elevated above 135/85 this visit, please have this repeated by your doctor within one month. --------------

## 2016-07-18 NOTE — ED Notes (Signed)
PO challenge done for pt. Pt given ginger ale and crackers.

## 2016-07-18 NOTE — ED Triage Notes (Signed)
Returned to ED today for continued upper right back pain. Seen yesterday and states she wasn't given any dx. Given meds that 'knocked me out' until I got home. Discharge papers stated chest wall pain and pt reports not having chest wall pain. Pt unable to get comfortable in stretcher. States she wants to know the answers to her pain instead of just taking meds to mask it.

## 2016-07-18 NOTE — ED Provider Notes (Signed)
[facility] Provider Note   CSN: [id number] Arrival date & time: 07/18/16  [id number]     History   Chief Complaint Chief Complaint  Patient presents with  . Shoulder Pain    HPI [name] is a 51 y.o. female.  Patient with history of cervical fusion from C4-C7, chronic lower back pain, presents with complaint of right back and shoulder blade pain. Symptoms started 2 days ago after using an elliptical exercise machine. Early yesterday morning she had severe pain and came to the emergency department where she was treated with IV narcotics with improvement in her pain. She was discharged home with Vicodin. She had reassuring labs, troponin, x-ray of her chest at that time. Her symptoms gradually worsened again yesterday and became severe this morning. She is frustrated by the fact that no one can tell her why she is having severe pain. Pain is described as shooting and sharp. She has an element of muscle spasm exacerbated with movement. Patient has chronic pain in her legs, especially her right side which is unchanged. She uses a cane to walk. She denies any acute exacerbation in pain in her lower extremities or weakness in her lower extremities. No change in her baseline ambulation. No urinary symptoms. No fevers. Patient states that she has been on gabapentin in the past but discontinued this because she stated it didn't help. She has received injections of steroids in her back but none recently.      Past Medical History:  Diagnosis Date  . Allergy   . Arthritis     Patient Active Problem List   Diagnosis Date Noted  . Essential hypertension 05/05/2016  . Chronic arthralgias of knees and hips 05/05/2016  . Cervical disc disorder with radiculopathy of cervical region 12/01/2015  . Tobacco user 07/18/2013  . Obesity, unspecified 07/18/2013  . Seasonal allergies 07/18/2013  . S/P cervical spinal fusion 02/27/2012    Past Surgical History:  Procedure Laterality Date    . SPINAL CORD DECOMPRESSION N/A 09/16/2010   Cervical spine- Dr. Yevette Edwardsumonski  . SPINAL FUSION N/A 09/16/2010   Cervical spine- Dr. Yevette Edwardsumonski    OB History    No data available       Home Medications    Prior to Admission medications   Medication Sig Start Date End Date Taking? Authorizing Provider  acetaminophen (TYLENOL) 650 MG CR tablet Take 650 mg by mouth every 8 (eight) hours as needed for pain.    [provider]  amLODipine (NORVASC) 5 MG tablet Take 1 tablet (5 mg total) by mouth daily. 05/05/16   Georgina QuintSagardia, Miguel Jose, MD  cetirizine (ZYRTEC) 10 MG tablet Take 1 tablet (10 mg total) by mouth daily. 07/21/15   Wallis BambergMani, Mario, PA-C  gabapentin (NEURONTIN) 300 MG capsule Take 2 capsules (600 mg total) by mouth 3 (three) times daily. Start with 1 tablet at night 3 days.  Then 1 tablet twice per day for 3 days.  Then 1 tablet three times per day. Patient taking differently: Take 600 mg by mouth 3 (three) times daily.  06/10/16   Trena PlattEnglish, Stephanie D, PA  HYDROcodone-acetaminophen (NORCO/VICODIN) 5-325 MG tablet Take 1-2 tablets by mouth every 6 (six) hours as needed for moderate pain. 07/17/16   Vanetta MuldersZackowski, Scott, MD  levonorgestrel (MIRENA) 20 MCG/24HR IUD 1 each by Intrauterine route once.    [provider]  Menthol, Topical Analgesic, (BENGAY EX) Apply 1 application topically daily as needed (pain).    [provider]  naproxen  sodium (ANAPROX) 220 MG tablet Take 220 mg by mouth daily as needed (pain).    [provider]    Family History Family History  Problem Relation Age of Onset  . Cancer Brother   . Breast cancer Maternal Aunt   . Breast cancer Maternal Grandmother     Social History Social History  Substance Use Topics  . Smoking status: Former Smoker    Packs/day: 0.25    Years: 33.00    Types: Cigarettes  . Smokeless tobacco: Never Used  . Alcohol use Yes     Allergies   Azithromycin; Claritin [loratadine]; and  Penicillins   Review of Systems Review of Systems  Constitutional: Negative for fever and unexpected weight change.  HENT: Negative for rhinorrhea and sore throat.   Eyes: Negative for redness.  Respiratory: Negative for cough.   Cardiovascular: Negative for chest pain.  Gastrointestinal: Negative for abdominal pain, constipation, diarrhea, nausea and vomiting.       Negative for fecal incontinence.   Genitourinary: Negative for dysuria, flank pain and hematuria.       Negative for urinary incontinence or retention.  Musculoskeletal: Positive for back pain. Negative for myalgias.  Skin: Negative for rash.  Neurological: Negative for weakness, numbness and headaches.       Denies saddle paresthesias.     Physical Exam Updated Vital Signs BP (!) 144/87 (BP Location: Left Arm)   Pulse 80   Temp 98.4 F (36.9 C) (Oral)   Resp 16   SpO2 100%   Physical Exam  Constitutional: She appears well-developed and well-nourished.  HENT:  Head: Normocephalic and atraumatic.  Eyes: Conjunctivae are normal. Right eye exhibits no discharge. Left eye exhibits no discharge.  Neck: Normal range of motion. Neck supple.  Cardiovascular: Normal rate, regular rhythm and normal heart sounds.   No murmur heard. Pulmonary/Chest: Effort normal and breath sounds normal.  Abdominal: Soft. There is no tenderness. There is no CVA tenderness.  Musculoskeletal:       Right shoulder: She exhibits tenderness. She exhibits normal range of motion and no bony tenderness.       Left shoulder: Normal.       Cervical back: She exhibits normal range of motion and no bony tenderness.       Thoracic back: She exhibits decreased range of motion and tenderness. She exhibits no bony tenderness.       Lumbar back: Normal.       Back:  No step-off noted with palpation of spine.   Neurological: She is alert. She has normal strength and normal reflexes. No sensory deficit.  5/5 strength in entire lower extremities  bilaterally. No sensation deficit.   Skin: Skin is warm and dry. No rash noted.  Psychiatric: She has a normal mood and affect.  Nursing note and vitals reviewed.    ED Treatments / Results   Radiology Dg Chest 2 View  Result Date: 07/17/2016 CLINICAL DATA:  RIGHT rib pain radiating to scapula for few days. EXAM: CHEST  2 VIEW COMPARISON:  Chest radiograph October 29, 2014 FINDINGS: Cardiomediastinal silhouette is normal. No pleural effusions or focal consolidations. Mild bronchitic changes. Trachea projects midline and there is no pneumothorax. Soft tissue planes and included osseous structures are non-suspicious. ACDF. Mild degenerative change of thoracic spine. IMPRESSION: Mild bronchitic changes. Electronically Signed   By: Awilda Metro M.D.   On: 07/17/2016 05:30    Procedures Procedures (including critical care time)  Medications Ordered in ED Medications  HYDROmorphone (  DILAUDID) injection 1 mg (1 mg Intramuscular Given 07/18/16 0850)  ketorolac (TORADOL) injection 60 mg (60 mg Intramuscular Given 07/18/16 0849)     Initial Impression / Assessment and Plan / ED Course  I have reviewed the triage vital signs and the nursing notes.  Pertinent labs & imaging results that were available during my care of the patient were reviewed by me and considered in my medical decision making (see chart for details).     Patient seen and examined. Work-up initiated. Medications ordered.   Vital signs reviewed and are as follows: BP (!) 144/87 (BP Location: Left Arm)   Pulse 80   Temp 98.4 F (36.9 C) (Oral)   Resp 16   SpO2 100%   I had a good long discussion with the patient regarding her lab and x-ray results from yesterday, previous MRI, and why I think she is likely having the pain. Will treat her pain acutely here with IM Dilaudid and Toradol. Will discuss with patient additional options for treatment at home. At this point, I have fairly suspicion for central cord etiology  given the fact that her baseline lower extremities symptoms are unchanged.  Patient vomited after treatment, however had good improvement in her pain. She was treated with Zofran and is tolerating ginger ale and crackers.  Plan is to discharge to home with stable course of steroids, continued scheduled Vicodin and Robaxin.  Strongly encouraged follow-up with her neurosurgeon or PCP in the next week for continued evaluation and management.  Patient encouraged to return to the emergency department if she has uncontrolled symptoms, new or worsening weakness in her lower extremities, difficulty walking, new symptoms or other concerns.  Final Clinical Impressions(s) / ED Diagnoses   Final diagnoses:  Acute right-sided thoracic back pain  Radicular pain  Muscle spasm   Patient's presents again to the emergency department with right-sided thoracic back pain in the area of her scapula. Pain is shooting and neurologic in nature. Patient has reproducible symptoms and some palpable spasm likely related to this. Previous MRI showed broad-based disc bulging in the lower cervical spine and upper thoracic spine. I feel that the patient likely exacerbated her symptoms after recent use of elliptical machine. She has no new or changing lower extremity symptoms to suggest central cord syndrome and I do not feel that she needs an emergent MRI at this time. No red flag symptoms of back pain. She has done well in emergency department for treatment of her symptoms. Plan discussed as above. Encouraged follow-up as above.  New Prescriptions Discharge Medication List as of 07/18/2016 11:06 AM    START taking these medications   Details  methocarbamol (ROBAXIN) 500 MG tablet Take 2 tablets (1,000 mg total) by mouth 4 (four) times daily., Starting Mon 07/18/2016, Print    predniSONE (DELTASONE) 20 MG tablet 3 Tabs PO Days 1-3, then 2 tabs PO Days 4-6, then 1 tab PO Day 7-9, then Half Tab PO Day 10-12, Print          Renne Crigler, PA-C 07/18/16 1452    Alvira Monday, MD 07/20/16 1446

## 2016-07-18 NOTE — ED Notes (Signed)
Pt is in stable condition upon d/c and ambulates from ED. 

## 2016-07-19 ENCOUNTER — Ambulatory Visit (INDEPENDENT_AMBULATORY_CARE_PROVIDER_SITE_OTHER): Payer: BC Managed Care – PPO | Admitting: Physician Assistant

## 2016-07-19 ENCOUNTER — Encounter: Payer: Self-pay | Admitting: Physician Assistant

## 2016-07-19 VITALS — BP 160/109 | HR 77 | Temp 98.1°F | Resp 16 | Ht 64.0 in | Wt 206.2 lb

## 2016-07-19 DIAGNOSIS — G8929 Other chronic pain: Secondary | ICD-10-CM | POA: Diagnosis not present

## 2016-07-19 DIAGNOSIS — R531 Weakness: Secondary | ICD-10-CM

## 2016-07-19 DIAGNOSIS — M5010 Cervical disc disorder with radiculopathy, unspecified cervical region: Secondary | ICD-10-CM

## 2016-07-19 DIAGNOSIS — Z981 Arthrodesis status: Secondary | ICD-10-CM | POA: Diagnosis not present

## 2016-07-19 DIAGNOSIS — M546 Pain in thoracic spine: Secondary | ICD-10-CM

## 2016-07-19 DIAGNOSIS — K5903 Drug induced constipation: Secondary | ICD-10-CM | POA: Diagnosis not present

## 2016-07-19 MED ORDER — POLYETHYLENE GLYCOL 3350 17 GM/SCOOP PO POWD: 17.0000 g | Freq: Two times a day (BID) | ORAL | 1 refills | Status: DC | PRN

## 2016-07-19 NOTE — Patient Instructions (Addendum)
  Please make an appt with neurology AFTER you have your MRI. You will receive a phone call to schedule both of these.  Come back and see me in 3 months.  See below for constipation relief - please choose one option.   For constipation   Make sure you are drinking enough water daily. Make sure you are getting enough fiber in your diet - this will make you regular - you can eat high fiber foods or use metamucil as a supplement - it is really important to drink enough water when using fiber supplements.  If your stools are hard or are formed balls or you have to strain a stool softener will help - use colace 2-3 capsule a day  Please choose one of the following options to relieve your constipation: 1) For gentle treatment of constipation Use Miralax 1-2 capfuls a day until your stools are soft and regular and then decrease the usage - you can use this daily  2) For more aggressive treatment of constipation Use 4 capfuls of Colace and 6 doses of Miralax and drink it in 2 hours - this should result in several watery stools - if it does not repeat the next day and then go to daily miralax for a week to make sure your bowels are clean and retrained to work properly  3) For the most aggressive treatment of constipation Use 14 capfuls of Miralax in 1 gallon of fluid (gatoraid or water work well or a combination of the two) and drink over 12h - it is ok to eat during this time and then use Miralax 1 capful daily for about 2 weeks to prevent the constipation from returning  IF you received an x-ray today, [facility] will [facility] an [facility]. Please contact [facility] at [phone] with questions or concerns regarding your invoice.   IF you received labwork today, you will receive an invoice from [facility]. Please contact [facility] at [phone] with questions or concerns regarding your invoice.   Our billing staff will not be able to assist you with questions regarding  bills from these companies.  You will be contacted with the lab results as soon as they are available. The fastest way to get your results is to activate your My Chart account. Instructions are located on the last page of this paperwork. If you have not heard from us regarding the results in 2 weeks, please contact this office.

## 2016-07-19 NOTE — Progress Notes (Signed)
[name]  MRN: [id number] DOB: 10/11/1965  PCP: Olivia Mackieaavon, Richard, MD  Subjective:  Pt is a 51 year old female PMH chronic back pain, cervical fusion from C4-C7, who presents to clinic for f/u back pain. 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   She was seen in the emergency department yesterday x 2 for right-sided mid back and shoulder pain. She was treated with IV narcotics with improvement in her pain. She was discharge to home with stable course of steroids, continued scheduled Vicodin and Robaxin. She is not feeling much improvement at this time. Today she is in tears "It's been so long since i've been able to do anything, because I am scared i'll trigger something". "I am on the verge of losing it".   Her most recent symptoms happened after using an elliptical machine. She is trying to lose weight which she recognizes is adding to her problem. - "feels like a brick is sitting there" in her mid back, right side.    H/o low back pain with sciatica - this is a worsening problem for her and is affecting her ADLs. She has to drive/ride sitting on her left buttock. Her leg "locks up" while switching from brake to gas pedal. She is fearful to go anywhere because of her driving. Her symptoms are constantly present, however have been this bad over the past several months.  With every flare her daily symptoms become worse.  Low back pain is constant occasionally radiated down right leg.She takes Aleve, tylenol, Bengay Weakness happens every day. Right arm and leg. She she can no longer lift things like detergent bottles due to weakness. Endorses tingling down right arm.  Denies saddle paresthesias, loss of bowel or bladder control.   She cannot walk >20 minutes or else her leg becomes weak and she feels like she will fall.  Makes it better - She walks bending forward or backward to alleviate the pain. She depends on a cane to walk.   C/o recent constipation due to pain medications from ED  visit.   She has had injections before- this helped and "seemed to last". Last injection >18 years ago.  Dr. Yevette Edwardsumonski did her spinal fusion around 2012. She never follow-up. Notes a "bad experience" in surgery. The anesthesiologist "wasn't paying attention and did not have my mouthguard in correctly. I woke up from surgery and had bitten through half my tongue. The still made me pay for it" "That surgery didn't do nothing to make me feel better."  "I'll be paying for these medical bills my whole life"  Review of Systems  Musculoskeletal: Positive for back pain and gait problem.  Neurological: Positive for weakness and numbness.  Psychiatric/Behavioral: Positive for sleep disturbance.    Patient Active Problem List   Diagnosis Date Noted  . Essential hypertension 05/05/2016  . Chronic arthralgias of knees and hips 05/05/2016  . Cervical disc disorder with radiculopathy of cervical region 12/01/2015  . Tobacco user 07/18/2013  . Obesity, unspecified 07/18/2013  . Seasonal allergies 07/18/2013  . S/P cervical spinal fusion 02/27/2012    Current Outpatient Prescriptions on File Prior to Visit  Medication Sig Dispense Refill  . amLODipine (NORVASC) 5 MG tablet Take 1 tablet (5 mg total) by mouth daily. 90 tablet 3  . levonorgestrel (MIRENA) 20 MCG/24HR IUD 1 each by Intrauterine route once.    . methocarbamol (ROBAXIN) 500 MG tablet Take 2 tablets (1,000 mg total) by mouth 4 (four) times daily.  20 tablet 0  . predniSONE (DELTASONE) 20 MG tablet 3 Tabs PO Days 1-3, then 2 tabs PO Days 4-6, then 1 tab PO Day 7-9, then Half Tab PO Day 10-12 20 tablet 0  . acetaminophen (TYLENOL) 650 MG CR tablet Take 650 mg by mouth every 8 (eight) hours as needed for pain.    . cetirizine (ZYRTEC) 10 MG tablet Take 1 tablet (10 mg total) by mouth daily. (Patient not taking: Reported on 07/19/2016) 30 tablet 11  . gabapentin (NEURONTIN) 300 MG capsule Take 2 capsules (600 mg total) by mouth 3 (three) times  daily. Start with 1 tablet at night 3 days.  Then 1 tablet twice per day for 3 days.  Then 1 tablet three times per day. (Patient not taking: Reported on 07/19/2016) 90 capsule 1  . HYDROcodone-acetaminophen (NORCO/VICODIN) 5-325 MG tablet Take 1-2 tablets by mouth every 6 (six) hours as needed for moderate pain. (Patient not taking: Reported on 07/19/2016) 14 tablet 0  . Menthol, Topical Analgesic, (BENGAY EX) Apply 1 application topically daily as needed (pain).    . naproxen sodium (ANAPROX) 220 MG tablet Take 220 mg by mouth daily as needed (pain).     No current facility-administered medications on file prior to visit.     Allergies  Allergen Reactions  . Azithromycin Shortness Of Breath and Swelling  . Claritin [Loratadine] Other (See Comments)    Discoloration of lips w/ swelling.  Marland Kitchen Penicillins Other (See Comments)    Has patient had a PCN reaction causing immediate rash, facial/tongue/throat swelling, SOB or lightheadedness with hypotension: unknown Has patient had a PCN reaction causing severe rash involving mucus membranes or skin necrosis: unknown Has patient had a PCN reaction that required hospitalization unknown Has patient had a PCN reaction occurring within the last 10 years: yes If all of the above answers are "NO", then may proceed with Cephalosporin use.      Objective:  BP (!) 149/86   Pulse 77   Temp 98.1 F (36.7 C) (Oral)   Resp 16   Ht 5\' 4"  (1.626 m)   Wt 206 lb 3.2 oz (93.5 kg)   LMP 06/17/2016   SpO2 96%   BMI 35.39 kg/m   Physical Exam  Constitutional: She is oriented to person, place, and time and well-developed, well-nourished, and in no distress. No distress.  Cardiovascular: Normal rate, regular rhythm and normal heart sounds.   Musculoskeletal:       Thoracic back: She exhibits decreased range of motion, tenderness and spasm. She exhibits no bony tenderness and no deformity.  Neurological: She is alert and oriented to person, place, and time.  She has normal sensation, normal strength and normal reflexes. She displays no weakness. She has an abnormal Straight Leg Raise Test. GCS score is 15.  Skin: Skin is warm and dry.  Psychiatric: Memory, affect and judgment normal.  tearful  Vitals reviewed.  12/2014 MR C-Spine IMPRESSION: 1. Anterior cervical fusion from C4 through C7 without foraminal or central canal stenosis. 2. Degenerative disc disease with disc height loss at C7-T1 with a mild broad-based disc bulge and bilateral uncovertebral degenerative changes. Mild bilateral foraminal stenosis. 3. At C3-4 there is a mild broad-based disc bulge and moderate right foraminal stenosis.  MRI LUMBAR SPINE IMPRESSION: The dominant finding in this case is that of facet arthropathy at L3-4, L4-5 and L5-S1, which could be a cause of back pain and referred facet syndrome pain.  Disc degeneration with disc bulges at L3-4,  L4-5 and L5-S1. No evidence of herniation, stenosis or neural compression. Assessment and Plan :  1. Chronic right-sided thoracic back pain 2. Right sided weakness 3. Cervical disc disorder with radiculopathy of cervical region - Ambulatory referral to Neurology - MR THORACIC SPINE W WO CONTRAST; Future - Pt is tearful during OV - she is frustrated that her chronic back pain is worsening and nothing can be done about it. In reviewing her chart, it appears she has not been evaluated by ortho or neuro in several years. She did not f/u with Dr. Yevette Edwards due a bad experience. He right-sided weakness is worsening, affecting her ADLs. Recent MR cervical and lower back shows multiple disc degeneration, foraminal stenosis with bulging discs. Will order MR thoracic spine and refer to neurology for work-up.   4. S/P cervical spinal fusion 5. Drug-induced constipation - polyethylene glycol powder (GLYCOLAX/MIRALAX) powder; Take 17 g by mouth 2 (two) times daily as needed.  Dispense: 578 g; Refill: 1 - Encouraged increased  fiber, water and exercise.   Marco Collie, PA-C  Primary Care at [facility] 07/19/2016 12:00 PM

## 2016-07-25 ENCOUNTER — Ambulatory Visit (INDEPENDENT_AMBULATORY_CARE_PROVIDER_SITE_OTHER): Payer: BC Managed Care – PPO | Admitting: Physician Assistant

## 2016-07-25 ENCOUNTER — Ambulatory Visit (INDEPENDENT_AMBULATORY_CARE_PROVIDER_SITE_OTHER): Payer: BC Managed Care – PPO

## 2016-07-25 ENCOUNTER — Telehealth: Payer: Self-pay | Admitting: Physician Assistant

## 2016-07-25 ENCOUNTER — Encounter: Payer: Self-pay | Admitting: Physician Assistant

## 2016-07-25 ENCOUNTER — Encounter: Payer: Self-pay | Admitting: Neurology

## 2016-07-25 VITALS — BP 132/88 | HR 78 | Temp 98.0°F | Resp 18 | Ht 64.0 in | Wt 206.2 lb

## 2016-07-25 DIAGNOSIS — M546 Pain in thoracic spine: Secondary | ICD-10-CM

## 2016-07-25 DIAGNOSIS — K5903 Drug induced constipation: Secondary | ICD-10-CM

## 2016-07-25 MED ORDER — VALACYCLOVIR HCL 1 G PO TABS: 1000.0000 mg | ORAL_TABLET | Freq: Two times a day (BID) | ORAL | 0 refills | Status: DC

## 2016-07-25 MED ORDER — NALOXEGOL OXALATE 12.5 MG PO TABS: 12.5000 mg | ORAL_TABLET | Freq: Every day | ORAL | 0 refills | Status: DC

## 2016-07-25 MED ORDER — CYCLOBENZAPRINE HCL 5 MG PO TABS: 5.0000 mg | ORAL_TABLET | Freq: Three times a day (TID) | ORAL | 0 refills | Status: DC | PRN

## 2016-07-25 NOTE — Progress Notes (Signed)
[name]  MRN: [id number] DOB: 01-27-1965  PCP: Olivia Mackie, MD  Chief Complaint  Patient presents with  . Shoulder Pain    having pain between shloulder blades  right side breast pain from shoulder   . Constipation    due to medication  hasnt had a bowel movement in 6 days   . Dizziness  . Nausea    Subjective:  Pt presents to clinic for  Review of Systems  Patient Active Problem List   Diagnosis Date Noted  . Essential hypertension 05/05/2016  . Chronic arthralgias of knees and hips 05/05/2016  . Cervical disc disorder with radiculopathy of cervical region 12/01/2015  . Tobacco user 07/18/2013  . Obesity, unspecified 07/18/2013  . Seasonal allergies 07/18/2013  . S/P cervical spinal fusion 02/27/2012    Current Outpatient Prescriptions on File Prior to Visit  Medication Sig Dispense Refill  . acetaminophen (TYLENOL) 650 MG CR tablet Take 650 mg by mouth every 8 (eight) hours as needed for pain.    Marland Kitchen amLODipine (NORVASC) 5 MG tablet Take 1 tablet (5 mg total) by mouth daily. 90 tablet 3  . levonorgestrel (MIRENA) 20 MCG/24HR IUD 1 each by Intrauterine route once.    . Menthol, Topical Analgesic, (BENGAY EX) Apply 1 application topically daily as needed (pain).    . methocarbamol (ROBAXIN) 500 MG tablet Take 2 tablets (1,000 mg total) by mouth 4 (four) times daily. 20 tablet 0  . naproxen sodium (ANAPROX) 220 MG tablet Take 220 mg by mouth daily as needed (pain).    . polyethylene glycol powder (GLYCOLAX/MIRALAX) powder Take 17 g by mouth 2 (two) times daily as needed. 578 g 1  . predniSONE (DELTASONE) 20 MG tablet 3 Tabs PO Days 1-3, then 2 tabs PO Days 4-6, then 1 tab PO Day 7-9, then Half Tab PO Day 10-12 20 tablet 0  . cetirizine (ZYRTEC) 10 MG tablet Take 1 tablet (10 mg total) by mouth daily. (Patient not taking: Reported on 07/19/2016) 30 tablet 11  . gabapentin (NEURONTIN) 300 MG capsule Take 2 capsules (600 mg total) by mouth 3 (three) times daily.  Start with 1 tablet at night 3 days.  Then 1 tablet twice per day for 3 days.  Then 1 tablet three times per day. (Patient not taking: Reported on 07/19/2016) 90 capsule 1  . HYDROcodone-acetaminophen (NORCO/VICODIN) 5-325 MG tablet Take 1-2 tablets by mouth every 6 (six) hours as needed for moderate pain. (Patient not taking: Reported on 07/19/2016) 14 tablet 0   No current facility-administered medications on file prior to visit.     Allergies  Allergen Reactions  . Azithromycin Shortness Of Breath and Swelling  . Claritin [Loratadine] Other (See Comments)    Discoloration of lips w/ swelling.  Marland Kitchen Penicillins Other (See Comments)    Has patient had a PCN reaction causing immediate rash, facial/tongue/throat swelling, SOB or lightheadedness with hypotension: unknown Has patient had a PCN reaction causing severe rash involving mucus membranes or skin necrosis: unknown Has patient had a PCN reaction that required hospitalization unknown Has patient had a PCN reaction occurring within the last 10 years: yes If all of the above answers are "NO", then may proceed with Cephalosporin use.     Pt patients past, family and social history were reviewed and updated.   Objective:  BP 132/88   Pulse 78   Temp 98 F (36.7 C) (Oral)   Resp 18   Ht 5\' 4"  (1.626 m)   Wt  206 lb 3.2 oz (93.5 kg)   LMP  (LMP Unknown)   SpO2 98%   BMI 35.39 kg/m   Physical Exam  Assessment and Plan :  No diagnosis found.  Benny LennertSarah Weber PA-C  Primary Care at [facility] 07/25/2016 5:12 PM

## 2016-07-25 NOTE — Telephone Encounter (Signed)
Accidentally signed first encounter. Previous message stated that the pt called following up on her MRI that is ordered and also wanted to know if we could read her x-ray results that she had done at the hospital. I told the pt I would work on the MRI. MRI did not meet medical necessity and they are requesting [facility] to be called for review. Pt would like to be contacted once Berkley Harveyauth is obtained at [phone].

## 2016-07-25 NOTE — Patient Instructions (Addendum)
  movantik - medication for narcotic induced constipation - it is ok to use 2 pills if 1 pill does not work  Gabapentin - this is for nerve pain - Day 1 - 1 pill, Day 2 1 pill 2x/day, Day 3 1 pill 3x/day and then continue that dose  Norco - hydrocodone - this is for pain - please use it - it is ok 1-2 pills  Valtrex - this is for shingles - take all this medication - 1 pill 3x/day  Prednisone - finish this medication  Flexeril - muscle relaxer - use 1-2 pills 3x/day  Stop robaxin - it is not helping  Please let me know if you get a rash - any rash in the area that is painful will help us truly identify the cause of your pain   IF you received an x-ray today, you will receive an invoice from [facility]. Please contact [facility] at [phone] with questions or concerns regarding your invoice.   IF you received labwork today, you will receive an invoice from [facility]. Please contact [facility] at [phone] with questions or concerns regarding your invoice.   Our billing staff will not be able to assist you with questions regarding bills from these companies.  You will be contacted with the lab results as soon as they are available. The fastest way to get your results is to activate your My Chart account. Instructions are located on the last page of this paperwork. If you have not heard from us regarding the results in 2 weeks, please contact this office.

## 2016-07-25 NOTE — Telephone Encounter (Signed)
Pt calling checking on MRI. She said she is not feeling well and wasn't sure if she wanted to come in or wait to have MRI done. I told her I would initiate the prior auth for the MRI today. She also said she went last week to the hospital to have x-rays done and was wondering if she came in if we could go over results with her. Please advise. Pt contact number is [phone].

## 2016-07-25 NOTE — Progress Notes (Signed)
[name]  MRN: [id number] DOB: 12/08/1965  PCP: Brien Few, MD  Chief Complaint  Patient presents with  . Shoulder Pain    having pain between shloulder blades  right side breast pain from shoulder   . Constipation    due to medication  hasnt had a bowel movement in 6 days   . Dizziness  . Nausea    Subjective:  Pt presents to clinic for continued pain in her right thoracic region that is extending through her right rib cage under her right breast and into her sternum.  It does not cross midline.  It hurts all the time and really is not worse with movement or use of her arms.  It hurts to even have her skin touched - she has no rash that she knows of.  She feels like the pain is getting worse but she also knows her tolerance of the pain is getting lower because she hurts all the time and cannot get relief.  She has not seen any change since the start of the prednisone.  She has not used the norco but the pain medications she was given in the ED helped.  She is really worried about her constipation.  It started last week and she has had constipation sincelast week - used miralax, enema, laxative, castor oil - no real success - hard small balls since starting the miralax - she abd is hurting and she is feeling bad as a result of that.  She is not sleeping because of the pain.  The pain in her back is not worse with eating but she is not eating much due to the constipation making her feel terrible.  No fevers or chills.  Never had shingles in the past.  Review of Systems  Gastrointestinal: Positive for constipation.  Musculoskeletal: Positive for back pain.    Patient Active Problem List   Diagnosis Date Noted  . Essential hypertension 05/05/2016  . Chronic arthralgias of knees and hips 05/05/2016  . Cervical disc disorder with radiculopathy of cervical region 12/01/2015  . Tobacco user 07/18/2013  . Obesity, unspecified 07/18/2013  . Seasonal allergies 07/18/2013  . S/P  cervical spinal fusion 02/27/2012    Current Outpatient Prescriptions on File Prior to Visit  Medication Sig Dispense Refill  . acetaminophen (TYLENOL) 650 MG CR tablet Take 650 mg by mouth every 8 (eight) hours as needed for pain.    Marland Kitchen amLODipine (NORVASC) 5 MG tablet Take 1 tablet (5 mg total) by mouth daily. 90 tablet 3  . levonorgestrel (MIRENA) 20 MCG/24HR IUD 1 each by Intrauterine route once.    . Menthol, Topical Analgesic, (BENGAY EX) Apply 1 application topically daily as needed (pain).    . naproxen sodium (ANAPROX) 220 MG tablet Take 220 mg by mouth daily as needed (pain).    . polyethylene glycol powder (GLYCOLAX/MIRALAX) powder Take 17 g by mouth 2 (two) times daily as needed. 578 g 1  . predniSONE (DELTASONE) 20 MG tablet 3 Tabs PO Days 1-3, then 2 tabs PO Days 4-6, then 1 tab PO Day 7-9, then Half Tab PO Day 10-12 20 tablet 0  . cetirizine (ZYRTEC) 10 MG tablet Take 1 tablet (10 mg total) by mouth daily. (Patient not taking: Reported on 07/19/2016) 30 tablet 11  . gabapentin (NEURONTIN) 300 MG capsule Take 2 capsules (600 mg total) by mouth 3 (three) times daily. Start with 1 tablet at night 3 days.  Then 1 tablet twice per day  for 3 days.  Then 1 tablet three times per day. (Patient not taking: Reported on 07/19/2016) 90 capsule 1  . HYDROcodone-acetaminophen (NORCO/VICODIN) 5-325 MG tablet Take 1-2 tablets by mouth every 6 (six) hours as needed for moderate pain. (Patient not taking: Reported on 07/19/2016) 14 tablet 0   No current facility-administered medications on file prior to visit.     Allergies  Allergen Reactions  . Azithromycin Shortness Of Breath and Swelling  . Claritin [Loratadine] Other (See Comments)    Discoloration of lips w/ swelling.  Marland Kitchen Penicillins Other (See Comments)    Has patient had a PCN reaction causing immediate rash, facial/tongue/throat swelling, SOB or lightheadedness with hypotension: unknown Has patient had a PCN reaction causing severe rash  involving mucus membranes or skin necrosis: unknown Has patient had a PCN reaction that required hospitalization unknown Has patient had a PCN reaction occurring within the last 10 years: yes If all of the above answers are "NO", then may proceed with Cephalosporin use.     Pt patients past, family and social history were reviewed and updated.   Objective:  BP 132/88   Pulse 78   Temp 98 F (36.7 C) (Oral)   Resp 18   Ht '5\' 4"'$  (1.626 m)   Wt 206 lb 3.2 oz (93.5 kg)   LMP  (LMP Unknown)   SpO2 98%   BMI 35.39 kg/m   Physical Exam  Constitutional: She is oriented to person, place, and time and well-developed, well-nourished, and in no distress.  Pt appears in pain - appears to have a hard time getting comfortable  HENT:  Head: Normocephalic and atraumatic.  Right Ear: Hearing and external ear normal.  Left Ear: Hearing and external ear normal.  Eyes: Conjunctivae are normal.  Neck: Normal range of motion.  Cardiovascular: Normal rate, regular rhythm and normal heart sounds.   No murmur heard. Pulmonary/Chest: Effort normal and breath sounds normal. She has no wheezes.    Musculoskeletal:       Back:  Palpable paraspinal muscle from cervical through thoracic region  Neurological: She is alert and oriented to person, place, and time. She has normal strength and normal reflexes. She displays normal reflexes. A sensory deficit is present. Gait normal.  She has pain with muscle testing but has good strength  Skin: Skin is warm and dry. No rash noted.  Very sensitive to touch just on the right side  Psychiatric: Mood, memory, affect and judgment normal.  Vitals reviewed.  Dg Thoracic Spine 2 View  Result Date: 07/25/2016 CLINICAL DATA:  Acute right thoracic pain. EXAM: THORACIC SPINE 2 VIEWS COMPARISON:  Chest x-ray July 17, 2016 FINDINGS: There is no evidence of thoracic spine fracture. Alignment is normal. There mild degenerative joint changes of thoracic spine with anterior  osteophytosis. IMPRESSION: No acute fracture or dislocation identified. 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 Electronically Signed   By: Abelardo Diesel M.D.   On: 07/25/2016 18:01   Dg Abd 1 View  Result Date: 07/25/2016 CLINICAL DATA:  Drug-induced constipation EXAM: ABDOMEN - 1 VIEW COMPARISON:  None. FINDINGS: The bowel gas pattern is normal. Moderate bowel content is identified throughout colon. No radio-opaque calculi or other significant radiographic abnormality are seen. IMPRESSION: No bowel obstruction. Moderate bowel content noted throughout colon. Electronically Signed   By: Abelardo Diesel M.D.   On: 07/25/2016 18:00    Assessment and Plan :  Acute right-sided thoracic back pain - Plan: DG Thoracic Spine 2 View, CMP14+EGFR,  valACYclovir (VALTREX) 1000 MG tablet, cyclobenzaprine (FLEXERIL) 5 MG tablet  Drug-induced constipation - Plan: DG Abd 1 View, naloxegol oxalate (MOVANTIK) 12.5 MG TABS tablet - trial of mediation to help - pt needs to use the norco so this will help the constipation not get worse  Pt is not getting relief from robaxin so we will switch to flexeril -I do not think that muscle is her primary problem but rather a 2nd situation to get not wanting to move because of pain - I suspect she might have shingles even without a rash but there is not explanation of her pain at this point.  She has not started the gabapentin so we will titrate that up over the next several days to see if that helps her pain in addition to encouraging her to use the norco.  She will finish the prednisone and start the valtrex.  We discussed the course of shingles - if she gets a rash I want to evaluate that because that will help determine the cause of the pain - definitively.  If the pain does not improve over the next 2 weeks the MRI is a good option as it has already been ordered.  She and husband understand plan and agree to it.  Windell Hummingbird PA-C  Primary Care at [facility] 07/26/2016 6:01 PM

## 2016-07-26 LAB — CMP14+EGFR
A/G RATIO: 1.3 (ref 1.2–2.2)
ALK PHOS: 77 IU/L (ref 39–117)
ALT: 54 IU/L — ABNORMAL HIGH (ref 0–32)
AST: 24 IU/L (ref 0–40)
Albumin: 3.6 g/dL (ref 3.5–5.5)
BUN/Creatinine Ratio: 12 (ref 9–23)
BUN: 13 mg/dL (ref 6–24)
CHLORIDE: 101 mmol/L (ref 96–106)
CO2: 28 mmol/L (ref 20–29)
Calcium: 9.1 mg/dL (ref 8.7–10.2)
Creatinine, Ser: 1.13 mg/dL — ABNORMAL HIGH (ref 0.57–1.00)
GFR calc Af Amer: 65 mL/min/{1.73_m2} (ref >59)
GFR calc non Af Amer: 56 mL/min/{1.73_m2} — ABNORMAL LOW (ref >59)
GLOBULIN, TOTAL: 2.7 g/dL (ref 1.5–4.5)
Glucose: 105 mg/dL — ABNORMAL HIGH (ref 65–99)
POTASSIUM: 4.5 mmol/L (ref 3.5–5.2)
SODIUM: 142 mmol/L (ref 134–144)
Total Protein: 6.3 g/dL (ref 6.0–8.5)

## 2016-07-28 NOTE — Telephone Encounter (Signed)
Please advise 

## 2016-07-29 NOTE — Telephone Encounter (Signed)
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  Her CX looked fine.  Please make sure her constipation is better and that her pain is being controlled.  Has she developed a rash.  I could see her tomorrow if she would like.

## 2016-07-29 NOTE — Telephone Encounter (Signed)
Fax received from [facility] stating that MRI has not been approved after medical review. Information as to why this was not approved is included in fax. [facility] can be contacted within 180 days of 07/28/16 at [phone] [phone] for provider courtesy review. An appeal can also be started if needed. I will give this fax to Medical Records to label [facility]. Please advise. Thanks!

## 2016-08-01 NOTE — Telephone Encounter (Signed)
Please advise 

## 2016-08-02 NOTE — Telephone Encounter (Signed)
So this is very confusing as the patient has seen both me and [name] - she originally ordered the MRI - she could help with the MRI.  If the patient is still having this horrible pain a recheck might be a good idea.  I have already addressed the xray question.

## 2016-08-09 NOTE — Telephone Encounter (Signed)
Please advise 

## 2016-08-10 NOTE — Telephone Encounter (Signed)
Erica the clinical nurse from [facility] called stating that the pt's case for MRI had been denied but she is reopening it. She said that they will need to receive a fax with clinical information no later than 08/15/16 regarding this pt's case or it could be denied again. I have faxed over ov notes, xray reports, and current pt medication list for them to review. The fax number is [phone]. Alcario Droughtrica can be reached at [phone] ext [phone].

## 2016-08-12 NOTE — Telephone Encounter (Signed)
Pt MRI has been approved via fax by [facility]. Authorization number is [id number] and is valid from 08/11/16-09/09/16. [facility] on 08/12/16.

## 2016-08-12 NOTE — Telephone Encounter (Signed)
FYI

## 2016-08-16 NOTE — Telephone Encounter (Signed)
Please call pt and let her know MRI appeal was approved. [facility]. She should receive a phone call soon to schedule this. Thank you!

## 2016-08-17 NOTE — Telephone Encounter (Signed)
Pt aware that MRI was scheduled on 8[facility]3 at [facility].

## 2016-08-26 ENCOUNTER — Ambulatory Visit
Admission: RE | Admit: 2016-08-26 | Discharge: 2016-08-26 | Disposition: A | Discharge status: Home / Self Care | Payer: BC Managed Care – PPO | Source: Ambulatory Visit | Attending: Physician Assistant | Admitting: Physician Assistant

## 2016-08-26 DIAGNOSIS — G8929 Other chronic pain: Secondary | ICD-10-CM

## 2016-08-26 DIAGNOSIS — R531 Weakness: Secondary | ICD-10-CM

## 2016-08-26 DIAGNOSIS — M546 Pain in thoracic spine: Secondary | ICD-10-CM

## 2016-08-26 MED ORDER — GADOBENATE DIMEGLUMINE 529 MG/ML IV SOLN
20.0000 mL | Freq: Once | INTRAVENOUS | Status: AC | PRN
  Administered 2016-08-26: 20 mL via INTRAVENOUS

## 2016-08-26 NOTE — Progress Notes (Signed)
Pt to nursing area by wheelchair from MRI. She is slurring her speech and very weak. Grip is equal bilaterally.  [id number] Dr. Benard Rinkurnes in to speak to pt and her husband was called to come and pick up the pt.

## 2016-08-27 ENCOUNTER — Other Ambulatory Visit: Payer: Self-pay | Admitting: Physician Assistant

## 2016-08-27 ENCOUNTER — Other Ambulatory Visit: Payer: Self-pay | Admitting: Urgent Care

## 2016-08-27 ENCOUNTER — Other Ambulatory Visit: Payer: Self-pay | Admitting: Family Medicine

## 2016-08-27 DIAGNOSIS — M47819 Spondylosis without myelopathy or radiculopathy, site unspecified: Secondary | ICD-10-CM

## 2016-08-27 DIAGNOSIS — K5903 Drug induced constipation: Secondary | ICD-10-CM

## 2016-08-27 DIAGNOSIS — M546 Pain in thoracic spine: Secondary | ICD-10-CM

## 2016-08-29 NOTE — Telephone Encounter (Signed)
Please advise...the patient has apt on 10/17/2016 with [facility]

## 2016-08-29 NOTE — Telephone Encounter (Signed)
I refilled them muscle relaxer - [facility] not [facility] as it does not appear that she has recently been given any pain medications that are opiods.

## 2016-08-31 ENCOUNTER — Telehealth: Payer: Self-pay | Admitting: Family Medicine

## 2016-08-31 NOTE — Telephone Encounter (Signed)
Pt calling stating that she had gotten all her refill but the zyrtec she said that her [birth year] is [birth year] and would like to be better

## 2016-09-01 ENCOUNTER — Other Ambulatory Visit: Payer: Self-pay

## 2016-09-01 MED ORDER — CETIRIZINE HCL 10 MG PO TABS: 10.0000 mg | ORAL_TABLET | Freq: Every day | ORAL | 3 refills | Status: DC

## 2016-09-01 NOTE — Telephone Encounter (Signed)
Zyrtec Refilled. Patient notified

## 2016-09-01 NOTE — Progress Notes (Signed)
Can you please check and see if she has an appt scheduled yet with neurology? Thank you!

## 2016-09-05 NOTE — Progress Notes (Signed)
I am fine with this being moved up. Please call pt and ask her if [facility] is [place] [facility] [place] [facility]. If she prefers to travel, let's book it earlier. Thank you!

## 2016-09-28 ENCOUNTER — Other Ambulatory Visit: Payer: Self-pay | Admitting: Family Medicine

## 2016-09-28 DIAGNOSIS — M47819 Spondylosis without myelopathy or radiculopathy, site unspecified: Secondary | ICD-10-CM

## 2016-10-19 ENCOUNTER — Encounter: Payer: Self-pay | Admitting: Physician Assistant

## 2016-10-19 ENCOUNTER — Ambulatory Visit (INDEPENDENT_AMBULATORY_CARE_PROVIDER_SITE_OTHER): Payer: BC Managed Care – PPO | Admitting: Physician Assistant

## 2016-10-19 VITALS — BP 120/86 | HR 92 | Temp 98.4°F | Resp 18 | Ht 64.0 in | Wt 202.2 lb

## 2016-10-19 DIAGNOSIS — M546 Pain in thoracic spine: Secondary | ICD-10-CM

## 2016-10-19 DIAGNOSIS — M549 Dorsalgia, unspecified: Secondary | ICD-10-CM

## 2016-10-19 DIAGNOSIS — G8929 Other chronic pain: Secondary | ICD-10-CM

## 2016-10-19 DIAGNOSIS — Z13820 Encounter for screening for osteoporosis: Secondary | ICD-10-CM | POA: Diagnosis not present

## 2016-10-19 MED ORDER — CYCLOBENZAPRINE HCL 10 MG PO TABS: 10.0000 mg | ORAL_TABLET | Freq: Three times a day (TID) | ORAL | 1 refills | Status: DC | PRN

## 2016-10-19 MED ORDER — TRAMADOL-ACETAMINOPHEN 37.5-325 MG PO TABS: 1.0000 | ORAL_TABLET | Freq: Four times a day (QID) | ORAL | 0 refills | Status: DC | PRN

## 2016-10-19 NOTE — Progress Notes (Signed)
[name]  MRN: [id number] DOB: 03-25-1965  PCP: Olivia Mackie, MD  Subjective:  Pt is a 51 year old female PMH chronic back pain, cervical fusion from C4-C7 who presents to clinic for f/u back pain. She initially saw me for this problem 06/2016.  She had an MRI of her thoracic spine last month which showed disc degeneration and spurring at multiple levels.  She was referred to neurology at that appt - She has appt with neurology Oct. 5 She was given handicap placard last OV and work note written for her to get up and walk every hour.  She stated all of these interventions are helping her, however her back pain is not gone. Present daily. "I just deal with it". Hurts worse with sitting for prolonged periods.  She has to move around and sit on one cheek or the other to remain in less pain.  Today she c/o con't back pain with worsening neck and shoulder tightness. Her fingers occasionally cramp up. She has to "shake out" her right hand sometimes due to tingling discomfort. She has taken Flexeril in the past, however cannot recall if it helped her. She admits to memory problems - she was in the [employer] a long time ago and reports difficulty with memory recall since that time.  She is frustrated because she has been dealing with this problem and no one has helped her. She cannot afford physical therapy. She looked into joining the [facility] last year, but says it is $100/week. She is not doing home exercises or stretches.    06/2016: She was seen in the emergency department for right-sided mid back and shoulder pain. She was treated with IV narcotics with improvement in her pain. She was discharge to home with stable course of steroids, continued scheduled Vicodin and Robaxin. She is not feeling much improvement at this time. Today she is in tears "It's been so long since i've been able to do anything, because I am scared i'll trigger something". "I am on the verge of losing it".  Symptoms happened  after using an elliptical machine. She is trying to lose weight which she recognizes is adding to her problem. - "feels like a brick is sitting there" in her mid back, right side.    OV 07/19/2016 HPI: H/o low back pain with sciatica - this is a worsening problem for her and is affecting her ADLs. She has to drive/ride sitting on her left buttock. Her leg "locks up" while switching from brake to gas pedal. She is fearful to go anywhere because of her driving. Her symptoms are constantly present, however have been this bad over the past several months.  With every flare her daily symptoms become worse.  Low back pain is constant occasionally radiated down right leg.She takes Aleve, tylenol, Bengay Weakness happens every day. Right arm and leg. She she can no longer lift things like detergent bottles due to weakness. Endorses tingling down right arm.  Denies saddle paresthesias, loss of bowel or bladder control.   She cannot walk >20 minutes or else her leg becomes weak and she feels like she will fall.  Makes it better - She walks bending forward or backward to alleviate the pain. She depends on a cane to walk.   She has had injections before- this helped and "seemed to last". Last injection >18 years ago.  Dr. Yevette Edwards did her spinal fusion around 2012. She never follow-up. Notes a "bad experience" in surgery. The anesthesiologist "wasn't  paying attention and did not have my mouthguard in correctly. I woke up from surgery and had bitten through half my tongue. The still made me pay for it" "That surgery didn't do nothing to make me feel better."  "I'll be paying for these medical bills my whole life"  Review of Systems  Gastrointestinal: Negative for constipation and diarrhea.  Musculoskeletal: Positive for back pain, gait problem and neck pain. Negative for arthralgias, joint swelling and neck stiffness.  Neurological: Positive for weakness and numbness.    Patient Active Problem List    Diagnosis Date Noted  . Essential hypertension 05/05/2016  . Chronic arthralgias of knees and hips 05/05/2016  . Cervical disc disorder with radiculopathy of cervical region 12/01/2015  . Tobacco user 07/18/2013  . Obesity, unspecified 07/18/2013  . Seasonal allergies 07/18/2013  . S/P cervical spinal fusion 02/27/2012    Current Outpatient Prescriptions on File Prior to Visit  Medication Sig Dispense Refill  . acetaminophen (TYLENOL) 650 MG CR tablet Take 650 mg by mouth every 8 (eight) hours as needed for pain.    Marland Kitchen amLODipine (NORVASC) 5 MG tablet Take 1 tablet (5 mg total) by mouth daily. 90 tablet 3  . cetirizine (ZYRTEC) 10 MG tablet Take 1 tablet (10 mg total) by mouth daily. 30 tablet 3  . cyclobenzaprine (FLEXERIL) 5 MG tablet TAKE 1 TO 2 TABLETS(5 TO 10 MG) BY MOUTH THREE TIMES DAILY AS NEEDED FOR MUSCLE SPASMS 30 tablet 0  . gabapentin (NEURONTIN) 300 MG capsule TAKE ONE CAPSULE BY MOUTH EVERY NIGHT AT BEDTIME FOR 3 DAYS, 1 CAPSULE TWICE DAILY FOR 3 DAYS, THEN 1 CAPSULE THREE TIMES DAILY AFTERWARDS 90 capsule 0  . levonorgestrel (MIRENA) 20 MCG/24HR IUD 1 each by Intrauterine route once.    . Menthol, Topical Analgesic, (BENGAY EX) Apply 1 application topically daily as needed (pain).    . naloxegol oxalate (MOVANTIK) 12.5 MG TABS tablet Take 1 tablet (12.5 mg total) by mouth daily. 30 tablet 0  . naproxen sodium (ANAPROX) 220 MG tablet Take 220 mg by mouth daily as needed (pain).    . polyethylene glycol powder (GLYCOLAX/MIRALAX) powder Take 17 g by mouth 2 (two) times daily as needed. 578 g 1  . predniSONE (DELTASONE) 20 MG tablet 3 Tabs PO Days 1-3, then 2 tabs PO Days 4-6, then 1 tab PO Day 7-9, then Half Tab PO Day 10-12 20 tablet 0  . valACYclovir (VALTREX) 1000 MG tablet Take 1 tablet (1,000 mg total) by mouth 2 (two) times daily. 30 tablet 0  . HYDROcodone-acetaminophen (NORCO/VICODIN) 5-325 MG tablet Take 1-2 tablets by mouth every 6 (six) hours as needed for moderate  pain. (Patient not taking: Reported on 07/19/2016) 14 tablet 0   No current facility-administered medications on file prior to visit.     Allergies  Allergen Reactions  . Azithromycin Shortness Of Breath and Swelling  . Claritin [Loratadine] Other (See Comments)    Discoloration of lips w/ swelling.  Marland Kitchen Penicillins Other (See Comments)    Has patient had a PCN reaction causing immediate rash, facial/tongue/throat swelling, SOB or lightheadedness with hypotension: unknown Has patient had a PCN reaction causing severe rash involving mucus membranes or skin necrosis: unknown Has patient had a PCN reaction that required hospitalization unknown Has patient had a PCN reaction occurring within the last 10 years: yes If all of the above answers are "NO", then may proceed with Cephalosporin use.      Objective:  BP 120/86   Pulse  92   Temp 98.4 F (36.9 C) (Oral)   Resp 18   Ht  (1.626 m)   Wt 202 lb 3.2 oz (91.7 kg)   LMP 09/18/2016   SpO2 98%   BMI 34.71 kg/m   Physical Exam  Constitutional: She is oriented to person, place, and time and well-developed, well-nourished, and in no distress. No distress.  Cardiovascular: Normal rate, regular rhythm and normal heart sounds.   Musculoskeletal:       Cervical back: She exhibits tenderness and spasm. She exhibits normal range of motion and no bony tenderness.       Lumbar back: She exhibits tenderness.  Neurological: She is alert and oriented to person, place, and time. GCS score is 15.  Skin: Skin is warm and dry.  Psychiatric: Mood, memory, affect and judgment normal.  Vitals reviewed.   MR t-spine 08/26/2016 IMPRESSION: Thoracic disc degeneration and spurring at multiple levels without significant spinal stenosis or cord compression. Congenital fusion of T3 and T4.  MR lumbar spine 12/2014 IMPRESSION: The dominant finding in this case is that of facet arthropathy at L3-4, L4-5 and L5-S1, which could be a cause of back pain  and referred facet syndrome pain. Disc degeneration with disc bulges at L3-4, L4-5 and L5-S1. No evidence of herniation, stenosis or neural compression.  MR c-spine 12/2014 IMPRESSION: 1. Anterior cervical fusion from C4 through C7 without foraminal or central canal stenosis. 2. Degenerative disc disease with disc height loss at C7-T1 with a mild broad-based disc bulge and bilateral uncovertebral degenerative changes. Mild bilateral foraminal stenosis. 3. At C3-4 there is a mild broad-based disc bulge and moderate right foraminal stenosis. Assessment and Plan :  1. Acute right-sided thoracic back pain 2. Chronic bilateral back pain, unspecified back location - cyclobenzaprine (FLEXERIL) 10 MG tablet; Take 1 tablet (10 mg total) by mouth 3 (three) times daily as needed for muscle spasms.  Dispense: 30 tablet; Refill: 1 - traMADol-acetaminophen (ULTRACET) 37.5-325 MG tablet; Take 1 tablet by mouth every 6 (six) hours as needed.  Dispense: 40 tablet; Refill: 0 - Pt has long h/o neck and back pain. MR imaging of c-spine, t-spine and lumbar spine show multiple changes, no spinal stenosis or cord compression. She has an appt with neurology Oct 5. She has not implemented a daily/weekly routine for back pain relief. Discussed at length with pt need to find a therapy that works well for her. She plans to try PT, even though she may not be able to afford a 6-week course. Consider acupuncture, chiropractor in the future. Advised daily stretches and exercises. She does have a problem with recall.   3. Screening for osteoporosis - VITAMIN D 25 Hydroxy (Vit-D Deficiency, Fractures)   Marco Collie, PA-C  Primary Care at [facility] 10/19/2016 9:13 AM

## 2016-10-19 NOTE — Patient Instructions (Addendum)
You will have back pain forever. We need to figure out which therapy works for you - then stick with this forever! Some options we can try are physical therapy (which will transition into daily home exercises/stretches), acupuncture, chiropractor. You should be doing daily stretches and/or exercises (see below).  Keep your appt with neurology. If they recommend physical therapy, go for a few session (however many you can afford) and learn exercises to do at home.   You may need to start waking up early, taking a pain medication, stretching and walking for 10 minutes before you go to work. This may get your back loosened up for the day.  In the future, you can try [facility]. This is a sliding scale pay acupuncture center. You may find getting acupuncture once every 2-3 weeks helps your back pain.   Take notes about the pain medications I prescribed for you today. I want to know if these work for you. Come back and see me in 2 months, bring your notes.   Back Exercises The following exercises strengthen the muscles that help to support the back. They also help to keep the lower back flexible. Doing these exercises can help to prevent back pain or lessen existing pain. If you have back pain or discomfort, try doing these exercises 2-3 times each day or as told by your health care provider. When the pain goes away, do them once each day, but increase the number of times that you repeat the steps for each exercise (do more repetitions). If you do not have back pain or discomfort, do these exercises once each day or as told by your health care provider. Exercises Single Knee to Chest  Repeat these steps 3-5 times for each leg: 1. Lie on your back on a firm bed or the floor with your legs extended. 2. Bring one knee to your chest. Your other leg should stay extended and in contact with the floor. 3. Hold your knee in place by grabbing your knee or thigh. 4. Pull on your knee until you feel a gentle  stretch in your lower back. 5. Hold the stretch for 10-30 seconds. 6. Slowly release and straighten your leg.  Pelvic Tilt  Repeat these steps 5-10 times: 1. Lie on your back on a firm bed or the floor with your legs extended. 2. Bend your knees so they are pointing toward the ceiling and your feet are flat on the floor. 3. Tighten your lower abdominal muscles to press your lower back against the floor. This motion will tilt your pelvis so your tailbone points up toward the ceiling instead of pointing to your feet or the floor. 4. With gentle tension and even breathing, hold this position for 5-10 seconds.  Cat-Cow  Repeat these steps until your lower back becomes more flexible: 1. Get into a hands-and-knees position on a firm surface. Keep your hands under your shoulders, and keep your knees under your hips. You may place padding under your knees for comfort. 2. Let your head hang down, and point your tailbone toward the floor so your lower back becomes rounded like the back of a cat. 3. Hold this position for 5 seconds. 4. Slowly lift your head and point your tailbone up toward the ceiling so your back forms a sagging arch like the back of a cow. 5. Hold this position for 5 seconds.  Press-Ups  Repeat these steps 5-10 times: 1. Lie on your abdomen (face-down) on the floor. 2. Place  your palms near your head, about shoulder-width apart. 3. While you keep your back as relaxed as possible and keep your hips on the floor, slowly straighten your arms to raise the top half of your body and lift your shoulders. Do not use your back muscles to raise your upper torso. You may adjust the placement of your hands to make yourself more comfortable. 4. Hold this position for 5 seconds while you keep your back relaxed. 5. Slowly return to lying flat on the floor.  Bridges  Repeat these steps 10 times: 1. Lie on your back on a firm surface. 2. Bend your knees so they are pointing toward the  ceiling and your feet are flat on the floor. 3. Tighten your buttocks muscles and lift your buttocks off of the floor until your waist is at almost the same height as your knees. You should feel the muscles working in your buttocks and the back of your thighs. If you do not feel these muscles, slide your feet 1-2 inches farther away from your buttocks. 4. Hold this position for 3-5 seconds. 5. Slowly lower your hips to the starting position, and allow your buttocks muscles to relax completely.  If this exercise is too easy, try doing it with your arms crossed over your chest. Abdominal Crunches  Repeat these steps 5-10 times: 1. Lie on your back on a firm bed or the floor with your legs extended. 2. Bend your knees so they are pointing toward the ceiling and your feet are flat on the floor. 3. Cross your arms over your chest. 4. Tip your chin slightly toward your chest without bending your neck. 5. Tighten your abdominal muscles and slowly raise your trunk (torso) high enough to lift your shoulder blades a tiny bit off of the floor. Avoid raising your torso higher than that, because it can put too much stress on your low back and it does not help to strengthen your abdominal muscles. 6. Slowly return to your starting position.  Back Lifts Repeat these steps 5-10 times: 1. Lie on your abdomen (face-down) with your arms at your sides, and rest your forehead on the floor. 2. Tighten the muscles in your legs and your buttocks. 3. Slowly lift your chest off of the floor while you keep your hips pressed to the floor. Keep the back of your head in line with the curve in your back. Your eyes should be looking at the floor. 4. Hold this position for 3-5 seconds. 5. Slowly return to your starting position.  Contact a health care provider if:  Your back pain or discomfort gets much worse when you do an exercise.  Your back pain or discomfort does not lessen within 2 hours after you exercise. If you  have any of these problems, stop doing these exercises right away. Do not do them again unless your health care provider says that you can. Get help right away if:  You develop sudden, severe back pain. If this happens, stop doing the exercises right away. Do not do them again unless your health care provider says that you can. This information is not intended to replace advice given to you by your health care provider. Make sure you discuss any questions you have with your health care provider. Document Released: 02/18/2004 Document Revised: 05/20/2015 Document Reviewed: 03/06/2014 Elsevier Interactive Patient Education  2017 [employer].   IF you received an x-ray today, you will receive an invoice from [facility]. Please contact [facility]  at [phone] with questions or concerns regarding your invoice.   IF you received labwork today, you will receive an invoice from [facility]. Please contact [facility] at [phone] with questions or concerns regarding your invoice.   Our billing staff will not be able to assist you with questions regarding bills from these companies.  You will be contacted with the lab results as soon as they are available. The fastest way to get your results is to activate your My Chart account. Instructions are located on the last page of this paperwork. If you have not heard from [place] regarding the results in 2 weeks, please contact this office.

## 2016-10-20 ENCOUNTER — Encounter: Payer: Self-pay | Admitting: Physician Assistant

## 2016-10-20 LAB — VITAMIN D 25 HYDROXY (VIT D DEFICIENCY, FRACTURES): Vit D, 25-Hydroxy: 24.7 ng/mL — ABNORMAL LOW (ref 30.0–100.0)

## 2016-10-20 NOTE — Progress Notes (Signed)
I recommend supplementation with 600 to 800 units of vitamin D3 daily.  maintain a daily total calcium intake (diet plus supplement) of 1000 mg daily.

## 2016-10-21 NOTE — Progress Notes (Signed)
Letter sent.

## 2016-10-28 ENCOUNTER — Encounter: Payer: Self-pay | Admitting: Neurology

## 2016-10-28 ENCOUNTER — Ambulatory Visit (INDEPENDENT_AMBULATORY_CARE_PROVIDER_SITE_OTHER): Payer: BC Managed Care – PPO | Admitting: Neurology

## 2016-10-28 VITALS — BP 110/80 | HR 90 | Ht 65.0 in | Wt 202.0 lb

## 2016-10-28 DIAGNOSIS — F329 Major depressive disorder, single episode, unspecified: Secondary | ICD-10-CM | POA: Diagnosis not present

## 2016-10-28 DIAGNOSIS — R202 Paresthesia of skin: Secondary | ICD-10-CM | POA: Diagnosis not present

## 2016-10-28 DIAGNOSIS — M2550 Pain in unspecified joint: Secondary | ICD-10-CM

## 2016-10-28 DIAGNOSIS — R4589 Other symptoms and signs involving emotional state: Secondary | ICD-10-CM

## 2016-10-28 DIAGNOSIS — G894 Chronic pain syndrome: Secondary | ICD-10-CM | POA: Diagnosis not present

## 2016-10-28 NOTE — Patient Instructions (Addendum)
Nerve testing of the right hand  Referral to behavior therapy for coping mechanism. Please call [phone] to schedule with [facility].
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
## 2016-11-09 ENCOUNTER — Ambulatory Visit (INDEPENDENT_AMBULATORY_CARE_PROVIDER_SITE_OTHER): Payer: BC Managed Care – PPO

## 2016-11-09 ENCOUNTER — Ambulatory Visit (INDEPENDENT_AMBULATORY_CARE_PROVIDER_SITE_OTHER): Payer: BC Managed Care – PPO | Admitting: Physician Assistant

## 2016-11-09 ENCOUNTER — Encounter: Payer: Self-pay | Admitting: Physician Assistant

## 2016-11-09 VITALS — BP 120/86 | HR 65 | Temp 98.1°F | Resp 16 | Ht 60.5 in | Wt 203.4 lb

## 2016-11-09 DIAGNOSIS — M25561 Pain in right knee: Secondary | ICD-10-CM

## 2016-11-09 DIAGNOSIS — M79661 Pain in right lower leg: Secondary | ICD-10-CM | POA: Diagnosis not present

## 2016-11-09 MED ORDER — TRAMADOL-ACETAMINOPHEN 37.5-325 MG PO TABS: 1.0000 | ORAL_TABLET | Freq: Four times a day (QID) | ORAL | 0 refills | Status: DC | PRN

## 2016-11-09 MED ORDER — CYCLOBENZAPRINE HCL ER 15 MG PO CP24: 15.0000 mg | ORAL_CAPSULE | Freq: Every day | ORAL | 0 refills | Status: DC | PRN
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
## 2016-11-09 NOTE — Patient Instructions (Addendum)
Please call the [facility] - get an appointment with Sharyn Lull.  Your x-ray looks great.  I will contact you with today's lab results when they come back.   Come back and see me next week.   Thank you for coming in today. I hope you feel we met your needs.  Feel free to call PCP if you have any questions or further requests.  Please consider signing up for MyChart if you do not already have it, as this is a great way to communicate with me.  Best,  [name] McVey, PA-C  IF you received an x-ray today, you will receive an invoice from [facility]. Please contact [facility] at [phone] with questions or concerns regarding your invoice.   IF you received labwork today, you will receive an invoice from [facility]. Please contact [facility] at [phone] with questions or concerns regarding your invoice.   Our billing staff will not be able to assist you with questions regarding bills from these companies.  You will be contacted with the lab results as soon as they are available. The fastest way to get your results is to activate your My Chart account. Instructions are located on the last page of this paperwork. If you have not heard from [place] regarding the results in 2 weeks, please contact this office.

## 2016-11-10 ENCOUNTER — Telehealth: Payer: Self-pay | Admitting: *Deleted

## 2016-11-10 ENCOUNTER — Telehealth: Payer: Self-pay

## 2016-11-10 LAB — CBC WITH DIFFERENTIAL/PLATELET
Basophils Absolute: 0.1 10*3/uL (ref 0.0–0.2)
Basos: 1 %
EOS (ABSOLUTE): 0.2 10*3/uL (ref 0.0–0.4)
Eos: 1 %
Hematocrit: 47.8 % — ABNORMAL HIGH (ref 34.0–46.6)
Hemoglobin: 15.5 g/dL (ref 11.1–15.9)
Immature Grans (Abs): 0 10*3/uL (ref 0.0–0.1)
Immature Granulocytes: 0 %
Lymphocytes Absolute: 4.6 10*3/uL — ABNORMAL HIGH (ref 0.7–3.1)
Lymphs: 43 %
MCH: 29.7 pg (ref 26.6–33.0)
MCHC: 32.4 g/dL (ref 31.5–35.7)
MCV: 92 fL (ref 79–97)
Monocytes Absolute: 0.8 10*3/uL (ref 0.1–0.9)
Monocytes: 8 %
Neutrophils Absolute: 5.1 10*3/uL (ref 1.4–7.0)
Neutrophils: 47 %
Platelets: 322 10*3/uL (ref 150–379)
RBC: 5.22 x10E6/uL (ref 3.77–5.28)
RDW: 14.8 % (ref 12.3–15.4)
WBC: 10.7 10*3/uL (ref 3.4–10.8)

## 2016-11-10 LAB — BASIC METABOLIC PANEL WITH GFR
BUN/Creatinine Ratio: 10 (ref 9–23)
CO2: 23 mmol/L (ref 20–29)
GFR calc Af Amer: 74 mL/min/{1.73_m2} (ref >59)
GFR calc non Af Amer: 64 mL/min/{1.73_m2} (ref >59)
Potassium: 4.6 mmol/L (ref 3.5–5.2)
Sodium: 141 mmol/L (ref 134–144)

## 2016-11-10 LAB — C-REACTIVE PROTEIN: CRP: 9.4 mg/L — ABNORMAL HIGH (ref 0.0–4.9)

## 2016-11-10 LAB — ANA W/REFLEX IF POSITIVE: Anti Nuclear Antibody(ANA): NEGATIVE

## 2016-11-10 LAB — BASIC METABOLIC PANEL
BUN: 10 mg/dL (ref 6–24)
Calcium: 9.8 mg/dL (ref 8.7–10.2)
Chloride: 104 mmol/L (ref 96–106)
Creatinine, Ser: 1.01 mg/dL — ABNORMAL HIGH (ref 0.57–1.00)
Glucose: 92 mg/dL (ref 65–99)

## 2016-11-10 LAB — RHEUMATOID FACTOR: Rheumatoid fact SerPl-aCnc: <10 [IU]/mL (ref 0.0–13.9)

## 2016-11-10 LAB — SEDIMENTATION RATE: Sed Rate: 8 mm/hr (ref 0–40)

## 2016-11-10 LAB — URIC ACID: Uric Acid: 4.7 mg/dL (ref 2.5–7.1)

## 2016-11-10 NOTE — Telephone Encounter (Signed)
PA started for Tramadol.  Questions not available today.  Please check tomorrow to complete them.

## 2016-11-10 NOTE — Telephone Encounter (Signed)
-----   [facility] sent at 11/10/2016  2:05 PM EDT ----- Referral for behavorial health, they called pt to schedule and pt declined an appointment/Dawn

## 2016-11-10 NOTE — Telephone Encounter (Signed)
Behavioral health called patient to schedule an appointment and patient declined.

## 2016-11-14 NOTE — Telephone Encounter (Signed)
After completing the necessary questions, cover my meds said a PA is not needed for this medication at this time.  I will notify the pharmacy.

## 2016-11-16 ENCOUNTER — Ambulatory Visit (INDEPENDENT_AMBULATORY_CARE_PROVIDER_SITE_OTHER): Payer: BC Managed Care – PPO | Admitting: Physician Assistant

## 2016-11-16 ENCOUNTER — Encounter: Payer: Self-pay | Admitting: Physician Assistant

## 2016-11-16 VITALS — BP 124/72 | HR 64 | Temp 98.3°F | Resp 16 | Ht 63.5 in | Wt 201.6 lb

## 2016-11-16 DIAGNOSIS — M549 Dorsalgia, unspecified: Secondary | ICD-10-CM | POA: Diagnosis not present

## 2016-11-16 DIAGNOSIS — G8929 Other chronic pain: Secondary | ICD-10-CM | POA: Diagnosis not present

## 2016-11-16 DIAGNOSIS — M7918 Myalgia, other site: Secondary | ICD-10-CM | POA: Diagnosis not present

## 2016-11-16 MED ORDER — DULOXETINE HCL 30 MG PO CPEP: 30.0000 mg | ORAL_CAPSULE | Freq: Every day | ORAL | 1 refills | Status: DC

## 2016-11-16 NOTE — Progress Notes (Signed)
[name]  MRN: [id number] DOB: 1965-11-13  PCP: Olivia Mackie, MD  Subjective:  Pt is a 51 year old female PMH chronic back pain, low back pain with sciatica, cervical fusion from C4-C7 who presents to clinic for f/u leg pain.  She has been seen several times in the past few months for MSK pain including her back, shoulder, knee. She hurts all the time, every day "I just deal with it". Her upper and lower back are the worst.  Last OV one week ago for knee pain - labs were negative. This is about 80% better. Hurts some with full extension.  She admits to anxiety and depression - related to work and how she feels in her body. 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 Has never been treated for this in the past.   She has an appt with neurology for a nerve conduction study soon.  Has not made appt yet for acupuncture.   Review of Systems  Musculoskeletal: Positive for arthralgias, back pain and gait problem. Negative for joint swelling.  Psychiatric/Behavioral: Positive for dysphoric mood. Negative for self-injury and suicidal ideas. The patient is nervous/anxious.     Patient Active Problem List   Diagnosis Date Noted  . Essential hypertension 05/05/2016  . Chronic arthralgias of knees and hips 05/05/2016  . Cervical disc disorder with radiculopathy of cervical region 12/01/2015  . Tobacco user 07/18/2013  . Obesity, unspecified 07/18/2013  . Seasonal allergies 07/18/2013  . S/P cervical spinal fusion 02/27/2012    Current Outpatient Prescriptions on File Prior to Visit  Medication Sig Dispense Refill  . acetaminophen (TYLENOL) 650 MG CR tablet Take 650 mg by mouth every 8 (eight) hours as needed for pain.    Marland Kitchen amLODipine (NORVASC) 5 MG tablet Take 1 tablet (5 mg total) by mouth daily. 90 tablet 3  . cetirizine (ZYRTEC) 10 MG tablet Take 1 tablet (10 mg total) by mouth daily. 30 tablet 3  . cyclobenzaprine (AMRIX) 15 MG 24 hr capsule Take 1 capsule (15 mg  total) by mouth daily as needed for muscle spasms. Max dose 30mg /day 30 capsule 0  . gabapentin (NEURONTIN) 300 MG capsule TAKE ONE CAPSULE BY MOUTH EVERY NIGHT AT BEDTIME FOR 3 DAYS, 1 CAPSULE TWICE DAILY FOR 3 DAYS, THEN 1 CAPSULE THREE TIMES DAILY AFTERWARDS (Patient taking differently: 1 tablet TID) 90 capsule 0  . levonorgestrel (MIRENA) 20 MCG/24HR IUD 1 each by Intrauterine route once.    . traMADol-acetaminophen (ULTRACET) 37.5-325 MG tablet Take 1 tablet by mouth every 6 (six) hours as needed. 30 tablet 0  . cyclobenzaprine (FLEXERIL) 10 MG tablet Take 1 tablet (10 mg total) by mouth 3 (three) times daily as needed for muscle spasms. 30 tablet 1  . Menthol, Topical Analgesic, (BENGAY EX) Apply 1 application topically daily as needed (pain).    . naloxegol oxalate (MOVANTIK) 12.5 MG TABS tablet Take 1 tablet (12.5 mg total) by mouth daily. (Patient not taking: Reported on 11/09/2016) 30 tablet 0  . naproxen sodium (ANAPROX) 220 MG tablet Take 220 mg by mouth daily as needed (pain).     No current facility-administered medications on file prior to visit.     Allergies  Allergen Reactions  . Azithromycin Shortness Of Breath and Swelling  . Claritin [Loratadine] Other (See Comments)    Discoloration of lips w/ swelling.  Marland Kitchen Penicillins Other (See Comments)    Has patient had a PCN reaction causing immediate rash, facial/tongue/throat swelling, SOB or  lightheadedness with hypotension: unknown Has patient had a PCN reaction causing severe rash involving mucus membranes or skin necrosis: unknown Has patient had a PCN reaction that required hospitalization unknown Has patient had a PCN reaction occurring within the last 10 years: yes If all of the above answers are "NO", then may proceed with Cephalosporin use.      Objective:  BP 124/72   Pulse 64   Temp 98.3 F (36.8 C) (Oral)   Resp 16   Ht 5' 3.5" (1.613 m)   Wt 201 lb 9.6 oz (91.4 kg)   LMP 11/10/2016   SpO2 98%   BMI 35.15  kg/m   Physical Exam  Constitutional: She is oriented to person, place, and time and well-developed, well-nourished, and in no distress. No distress.  Neurological: She is alert and oriented to person, place, and time. GCS score is 15.  Skin: Skin is warm and dry.  Psychiatric: Mood, memory, affect and judgment normal.  Tearful   Vitals reviewed.   Assessment and Plan :  1. Musculoskeletal pain 2. Chronic bilateral back pain, unspecified back location - DULoxetine (CYMBALTA) 30 MG capsule; Take 1 capsule (30 mg total) by mouth daily. May increase to 60mg  qd after 1 week.  Dispense: 60 capsule; Refill: 1 - Knee pain resolving since last OV. Plan to start pt on SNRI for chronic back pain. Discussed medication side effects and titration schedule. RTC in 4 weeks to f/u.   Marco CollieWhitney Makaylen Thieme, PA-C  [facility] 11/16/2016 3:24 PM

## 2016-11-16 NOTE — Patient Instructions (Addendum)
Cymbalta: Take 30 mg once daily for 1 week, then increase to 60 mg once daily as tolerated.  Administer without regard to meals. This medication make take several weeks to "kick in".  Do not take this with your pain medication Ultracet  Come back and see me in 4-6 weeks to check in.   Have you made an appointment with Marcelino DusterMichelle at [facility] yet?  IF you received an x-ray today, you will receive an invoice from [facility]. Please contact [facility] at [phone] with questions or concerns regarding your invoice.   IF you received labwork today, you will receive an invoice from [facility]. Please contact [facility] at [phone] with questions or concerns regarding your invoice.   Our billing staff will not be able to assist you with questions regarding bills from these companies.  You will be contacted with the lab results as soon as they are available. The fastest way to get your results is to activate your My Chart account. Instructions are located on the last page of this paperwork. If you have not heard from us regarding the results in 2 weeks, please contact this office.

## 2016-11-22 ENCOUNTER — Encounter: Payer: Self-pay | Admitting: Physician Assistant

## 2016-11-22 ENCOUNTER — Telehealth: Payer: Self-pay | Admitting: Student

## 2016-11-22 NOTE — Telephone Encounter (Signed)
Pt is [facility] to [facility] with [facility] regarding FMLA paperwork   Best number [phone]

## 2016-11-23 ENCOUNTER — Telehealth: Payer: Self-pay | Admitting: Physician Assistant

## 2016-11-23 NOTE — Telephone Encounter (Signed)
Patient needs FMLA forms completed by [facility] for her pain, I was not sure how to complete the forms so I left them blank, I will place them in your box on 11/23/16. Please return them to the FMLA/Disability box at the 102 checkout desk with in 5-7 business days. Thank you!

## 2016-11-23 NOTE — Telephone Encounter (Signed)
Pt req talking with McVey. Forwarded message to her

## 2016-11-24 ENCOUNTER — Encounter: Payer: BC Managed Care – PPO | Admitting: Neurology

## 2016-11-24 ENCOUNTER — Other Ambulatory Visit: Payer: Self-pay | Admitting: Physician Assistant

## 2016-11-24 DIAGNOSIS — M545 Low back pain: Principal | ICD-10-CM

## 2016-11-24 DIAGNOSIS — G8929 Other chronic pain: Secondary | ICD-10-CM

## 2016-11-24 MED ORDER — DULOXETINE HCL 20 MG PO CPEP: 20.0000 mg | ORAL_CAPSULE | Freq: Every day | ORAL | 1 refills | Status: DC

## 2016-11-24 NOTE — Progress Notes (Signed)
Reduce Cymbalta dose to 20mg  qd.

## 2016-11-24 NOTE — Telephone Encounter (Signed)
Pt is calling stating that her cymbalta is making her feel drowsy and "zombied out".  She states she stayed home yesterday from work since she couldn't stay focused or awake.  Please advise [phone]

## 2016-11-29 NOTE — Telephone Encounter (Signed)
FMLA paperwork placed in FMLA box

## 2016-11-29 NOTE — Telephone Encounter (Signed)
Paperwork scanned and faxed on 11/29/16

## 2016-12-13 ENCOUNTER — Other Ambulatory Visit: Payer: Self-pay | Admitting: Physician Assistant

## 2016-12-13 DIAGNOSIS — M25562 Pain in left knee: Principal | ICD-10-CM

## 2016-12-13 DIAGNOSIS — M25561 Pain in right knee: Secondary | ICD-10-CM

## 2016-12-13 MED ORDER — PREDNISONE 20 MG PO TABS: ORAL_TABLET | ORAL | 0 refills | Status: DC

## 2016-12-13 MED ORDER — MELOXICAM 15 MG PO TABS: 15.0000 mg | ORAL_TABLET | Freq: Every day | ORAL | 1 refills | Status: DC

## 2017-01-15 ENCOUNTER — Other Ambulatory Visit: Payer: Self-pay | Admitting: Physician Assistant

## 2017-01-25 ENCOUNTER — Encounter: Payer: Self-pay | Admitting: Physician Assistant

## 2017-02-01 ENCOUNTER — Other Ambulatory Visit: Payer: Self-pay

## 2017-02-01 ENCOUNTER — Ambulatory Visit: Payer: BC Managed Care – PPO | Admitting: Physician Assistant

## 2017-02-01 ENCOUNTER — Encounter: Payer: Self-pay | Admitting: Physician Assistant

## 2017-02-01 VITALS — BP 114/76 | HR 77 | Temp 98.0°F | Resp 16 | Ht 64.0 in | Wt 200.2 lb

## 2017-02-01 DIAGNOSIS — R220 Localized swelling, mass and lump, head: Secondary | ICD-10-CM

## 2017-02-01 DIAGNOSIS — J029 Acute pharyngitis, unspecified: Secondary | ICD-10-CM

## 2017-02-01 LAB — POCT RAPID STREP A (OFFICE): Rapid Strep A Screen: NEGATIVE

## 2017-02-01 MED ORDER — RANITIDINE HCL 150 MG PO TABS: 150.0000 mg | ORAL_TABLET | Freq: Two times a day (BID) | ORAL | 0 refills | Status: DC

## 2017-02-01 MED ORDER — METHYLPREDNISOLONE ACETATE 80 MG/ML IJ SUSP
80.0000 mg | Freq: Once | INTRAMUSCULAR | Status: AC
  Administered 2017-02-01: 80 mg via INTRAMUSCULAR

## 2017-02-01 MED ORDER — EPINEPHRINE 0.3 MG/0.3ML IJ SOAJ: 0.3000 mg | Freq: Once | INTRAMUSCULAR | 0 refills | Status: AC

## 2017-02-01 NOTE — Progress Notes (Signed)
[name]  MRN: [id number] DOB: 1965/10/14  PCP: Olivia Mackie, MD  Subjective:  Pt is a 52 year old female PMH HTN, obesity, who presents to clinic for mouth swelling x 3 days. Swelling of her lips, tongue and back of throat. Symptoms are gradually improving. She has been eating liquids mostly. One episode of having to cough up solid foods as she could not swallow it. Endorses talking with a lisp due to swelling of tongue. She took one Benadryl today, helped some. No new medication changes, she did not go out to eat, no shellfish. Denies choking, stridor, difficulty breathing, drooling, rash, nausea, vomiting, fever, chills. This has never happened to her before.   Review of Systems  Constitutional: Negative for chills, diaphoresis, fatigue and fever.  HENT: Positive for trouble swallowing and voice change.   Respiratory: Positive for choking. Negative for shortness of breath, wheezing and stridor.   Gastrointestinal: Negative for abdominal pain, nausea and vomiting.    Patient Active Problem List   Diagnosis Date Noted  . Essential hypertension 05/05/2016  . Chronic arthralgias of knees and hips 05/05/2016  . Cervical disc disorder with radiculopathy of cervical region 12/01/2015  . Tobacco user 07/18/2013  . Obesity, unspecified 07/18/2013  . Seasonal allergies 07/18/2013  . S/P cervical spinal fusion 02/27/2012    Current Outpatient Medications on File Prior to Visit  Medication Sig Dispense Refill  . acetaminophen (TYLENOL) 650 MG CR tablet Take 650 mg by mouth every 8 (eight) hours as needed for pain.    Marland Kitchen amLODipine (NORVASC) 5 MG tablet Take 1 tablet (5 mg total) by mouth daily. 90 tablet 3  . cetirizine (ZYRTEC) 10 MG tablet TAKE 1 TABLET(10 MG) BY MOUTH DAILY 30 tablet 0  . levonorgestrel (MIRENA) 20 MCG/24HR IUD 1 each by Intrauterine route once.    . meloxicam (MOBIC) 15 MG tablet Take 1 tablet (15 mg total) by mouth daily. 30 tablet 1  . Menthol, Topical  Analgesic, (BENGAY EX) Apply 1 application topically daily as needed (pain).    . cyclobenzaprine (AMRIX) 15 MG 24 hr capsule Take 1 capsule (15 mg total) by mouth daily as needed for muscle spasms. Max dose 30mg /day (Patient not taking: Reported on 02/01/2017) 30 capsule 0  . cyclobenzaprine (FLEXERIL) 10 MG tablet Take 1 tablet (10 mg total) by mouth 3 (three) times daily as needed for muscle spasms. (Patient not taking: Reported on 02/01/2017) 30 tablet 1  . DULoxetine (CYMBALTA) 20 MG capsule Take 1 capsule (20 mg total) by mouth daily. (Patient not taking: Reported on 02/01/2017) 60 capsule 1  . gabapentin (NEURONTIN) 300 MG capsule TAKE ONE CAPSULE BY MOUTH EVERY NIGHT AT BEDTIME FOR 3 DAYS, 1 CAPSULE TWICE DAILY FOR 3 DAYS, THEN 1 CAPSULE THREE TIMES DAILY AFTERWARDS (Patient not taking: Reported on 02/01/2017) 90 capsule 0  . naloxegol oxalate (MOVANTIK) 12.5 MG TABS tablet Take 1 tablet (12.5 mg total) by mouth daily. (Patient not taking: Reported on 11/09/2016) 30 tablet 0  . naproxen sodium (ANAPROX) 220 MG tablet Take 220 mg by mouth daily as needed (pain).    . traMADol-acetaminophen (ULTRACET) 37.5-325 MG tablet Take 1 tablet by mouth every 6 (six) hours as needed. (Patient not taking: Reported on 02/01/2017) 30 tablet 0   No current facility-administered medications on file prior to visit.     Allergies  Allergen Reactions  . Azithromycin Shortness Of Breath and Swelling  . Claritin [Loratadine] Other (See Comments)    Discoloration of lips  w/ swelling.  Marland Kitchen. Penicillins Other (See Comments)    Has patient had a PCN reaction causing immediate rash, facial/tongue/throat swelling, SOB or lightheadedness with hypotension: unknown Has patient had a PCN reaction causing severe rash involving mucus membranes or skin necrosis: unknown Has patient had a PCN reaction that required hospitalization unknown Has patient had a PCN reaction occurring within the last 10 years: yes If all of the above answers  are "NO", then may proceed with Cephalosporin use.      Objective:  BP 114/76   Pulse 77   Temp 98 F (36.7 C) (Oral)   Resp 16   Ht 5\' 4"  (1.626 m)   Wt 200 lb 3.2 oz (90.8 kg)   LMP 01/11/2017   SpO2 98%   BMI 34.36 kg/m   Physical Exam  Constitutional: She is oriented to person, place, and time and well-developed, well-nourished, and in no distress. No distress.  HENT:  Right Ear: Tympanic membrane normal.  Left Ear: Tympanic membrane normal.  Nose: Mucosal edema present. No rhinorrhea. Right sinus exhibits no maxillary sinus tenderness and no frontal sinus tenderness. Left sinus exhibits no maxillary sinus tenderness and no frontal sinus tenderness.  Mouth/Throat: Mucous membranes are normal. No uvula swelling. Posterior oropharyngeal edema present. No oropharyngeal exudate or posterior oropharyngeal erythema.  Tongue mildly swollen. No involvement of lips.   Cardiovascular: Normal rate, regular rhythm and normal heart sounds.  Pulmonary/Chest: Effort normal and breath sounds normal. No respiratory distress. She has no wheezes. She has no rales.  Neurological: She is alert and oriented to person, place, and time. GCS score is 15.  Skin: Skin is warm and dry.  Psychiatric: Mood, memory, affect and judgment normal.  Vitals reviewed.   Results for orders placed or performed in visit on 02/01/17  POCT rapid strep A  Result Value Ref Range   Rapid Strep A Screen Negative Negative    Assessment and Plan :  1. Tongue swelling - methylPREDNISolone acetate (DEPO-MEDROL) injection 80 mg - Ambulatory referral to Allergy - ranitidine (ZANTAC) 150 MG tablet; Take 1 tablet (150 mg total) by mouth 2 (two) times daily.  Dispense: 60 tablet; Refill: 0 - EPINEPHrine 0.3 mg/0.3 mL IJ SOAJ injection; Inject 0.3 mLs (0.3 mg total) into the muscle once for 1 dose.  Dispense: 1 Device; Refill: 0 - Suspect edema of tongue and oropharynx 2/2 allergic reaction. Start benadryl 50mg  q 4-6 hrs x  2 d. Then take Zantac AND zyrtec as prescribed x 5 d. Emergency department precautions discussed. Plan to refer to allergy for testing. Rx for Epi Pen. RTC PRN. She understands and agrees.   2. Sore throat - POCT rapid strep A - Culture, Group A Strep  Marco CollieWhitney Yui Mulvaney, PA-C  Primary Care at [facility] 02/01/2017 11:34 AM

## 2017-02-01 NOTE — Patient Instructions (Addendum)
  Take benadryl 50 mg every 4-6 hrs x 2 days. Then take Zantac (prescription) AND zyrtec as prescribed x 5 days. Go straight to the emergency department if your symptoms worsen.   You will receive a phone call to schedule an appointment with allergist.   I sent you a prescription for an epipen.   Thank you for coming in today. I hope you feel we met your needs.  Feel free to call PCP if you have any questions or further requests.  Please consider signing up for MyChart if you do not already have it, as this is a great way to communicate with me.  Best,  [name] McVey, PA-C  IF you received an x-ray today, you will receive an invoice from [facility]. Please contact [facility] at [phone] with questions or concerns regarding your invoice.   IF you received labwork today, you will receive an invoice from [facility]. Please contact [facility] at [phone] with questions or concerns regarding your invoice.   Our billing staff will not be able to assist you with questions regarding bills from these companies.  You will be contacted with the lab results as soon as they are available. The fastest way to get your results is to activate your My Chart account. Instructions are located on the last page of this paperwork. If you have not heard from [place] regarding the results in 2 weeks, please contact this office.

## 2017-02-03 ENCOUNTER — Encounter: Payer: Self-pay | Admitting: Physician Assistant

## 2017-02-03 ENCOUNTER — Ambulatory Visit: Payer: BC Managed Care – PPO | Admitting: Physician Assistant

## 2017-02-03 ENCOUNTER — Other Ambulatory Visit: Payer: Self-pay

## 2017-02-03 VITALS — BP 130/80 | HR 80 | Temp 98.7°F | Resp 16 | Ht 64.0 in | Wt 202.2 lb

## 2017-02-03 DIAGNOSIS — R0989 Other specified symptoms and signs involving the circulatory and respiratory systems: Secondary | ICD-10-CM | POA: Diagnosis not present

## 2017-02-03 DIAGNOSIS — R220 Localized swelling, mass and lump, head: Secondary | ICD-10-CM

## 2017-02-03 DIAGNOSIS — R09A2 Foreign body sensation, throat: Secondary | ICD-10-CM

## 2017-02-03 DIAGNOSIS — M272 Inflammatory conditions of jaws: Secondary | ICD-10-CM | POA: Diagnosis not present

## 2017-02-03 LAB — POCT CBC
Granulocyte percent: 61.6 %G (ref 37–80)
HCT, POC: 46.8 % (ref 37.7–47.9)
Hemoglobin: 15.3 g/dL (ref 12.2–16.2)
Lymph, poc: 2.8 (ref 0.6–3.4)
MCH, POC: 29.8 pg (ref 27–31.2)
MCHC: 32.8 g/dL (ref 31.8–35.4)
MCV: 90.8 fL (ref 80–97)
MID (cbc): 0.7 (ref 0–0.9)
MPV: 7.4 fL (ref 0–99.8)
POC Granulocyte: 5.5 (ref 2–6.9)
POC LYMPH PERCENT: 31 %L (ref 10–50)
POC MID %: 7.4 % (ref 0–12)
Platelet Count, POC: 245 10*3/uL (ref 142–424)
RBC: 5.15 M/uL (ref 4.04–5.48)
RDW, POC: 15.6 %
WBC: 8.9 10*3/uL (ref 4.6–10.2)

## 2017-02-03 LAB — CULTURE, GROUP A STREP: Strep A Culture: NEGATIVE

## 2017-02-03 MED ORDER — CLINDAMYCIN HCL 150 MG PO CAPS: 450.0000 mg | ORAL_CAPSULE | Freq: Three times a day (TID) | ORAL | 0 refills | Status: AC

## 2017-02-03 NOTE — Patient Instructions (Addendum)
  Start taking clindamycin 450mg  every 8 hours for the next 2 weeks.  Come back and see me in 5 days, I'd like to monitor to make sure you are improving.  You will get a call to schedule an appointment with ENT.    Plantar wart treatment: Keep the wart covered with duct tape for cycles lasting six days and then remove the tape, soak the wart, debride the wart with an emery board or pumice stone, and then leave the wart uncovered on the sixth night; reapply duct tape the next morning. Continue with this reatment until wart resolution or for a maximum of two months.  IF you received an x-ray today, you will receive an invoice from [facility]. Please contact [facility] at [phone] with questions or concerns regarding your invoice.   IF you received labwork today, you will receive an invoice from [facility]. Please contact [facility] at [phone] with questions or concerns regarding your invoice.   Our billing staff will not be able to assist you with questions regarding bills from these companies.  You will be contacted with the lab results as soon as they are available. The fastest way to get your results is to activate your My Chart account. Instructions are located on the last page of this paperwork. If you have not heard from us regarding the results in 2 weeks, please contact this office.

## 2017-02-03 NOTE — Progress Notes (Signed)
BRITANY CALLICOTT  MRN: [id number] DOB: 11/14/1965  PCP: Olivia Mackie, MD  Subjective:  Pt is a 52 year old female who presents to clinic for f/u tongue pain.  She was here 1/9 for tongue swelling and treated for allergic reaction with benadryl and prednisone.  Today she endorses her condition has worsened. She is drooling. "feels like there is something in my throat".  Denies difficulty swallowing or breathing, stridor, shob, n/v, chest pain.   Review of Systems  Constitutional: Negative for chills, diaphoresis, fatigue and fever.  HENT: Positive for drooling, sore throat, trouble swallowing and voice change. Negative for dental problem and facial swelling.   Respiratory: Negative for apnea, cough, choking, shortness of breath, wheezing and stridor.   Cardiovascular: Negative for chest pain and palpitations.  Gastrointestinal: Negative for abdominal pain, nausea and vomiting.    Patient Active Problem List   Diagnosis Date Noted  . Essential hypertension 05/05/2016  . Chronic arthralgias of knees and hips 05/05/2016  . Cervical disc disorder with radiculopathy of cervical region 12/01/2015  . Tobacco user 07/18/2013  . Obesity, unspecified 07/18/2013  . Seasonal allergies 07/18/2013  . S/P cervical spinal fusion 02/27/2012    Current Outpatient Medications on File Prior to Visit  Medication Sig Dispense Refill  . DULoxetine (CYMBALTA) 20 MG capsule Take 1 capsule (20 mg total) by mouth daily. 60 capsule 1  . acetaminophen (TYLENOL) 650 MG CR tablet Take 650 mg by mouth every 8 (eight) hours as needed for pain.    Marland Kitchen amLODipine (NORVASC) 5 MG tablet Take 1 tablet (5 mg total) by mouth daily. 90 tablet 3  . cetirizine (ZYRTEC) 10 MG tablet TAKE 1 TABLET(10 MG) BY MOUTH DAILY 30 tablet 0  . cyclobenzaprine (AMRIX) 15 MG 24 hr capsule Take 1 capsule (15 mg total) by mouth daily as needed for muscle spasms. Max dose 30mg /day (Patient not taking: Reported on 02/01/2017) 30 capsule 0   . cyclobenzaprine (FLEXERIL) 10 MG tablet Take 1 tablet (10 mg total) by mouth 3 (three) times daily as needed for muscle spasms. (Patient not taking: Reported on 02/01/2017) 30 tablet 1  . gabapentin (NEURONTIN) 300 MG capsule TAKE ONE CAPSULE BY MOUTH EVERY NIGHT AT BEDTIME FOR 3 DAYS, 1 CAPSULE TWICE DAILY FOR 3 DAYS, THEN 1 CAPSULE THREE TIMES DAILY AFTERWARDS (Patient not taking: Reported on 02/01/2017) 90 capsule 0  . levonorgestrel (MIRENA) 20 MCG/24HR IUD 1 each by Intrauterine route once.    . meloxicam (MOBIC) 15 MG tablet Take 1 tablet (15 mg total) by mouth daily. 30 tablet 1  . Menthol, Topical Analgesic, (BENGAY EX) Apply 1 application topically daily as needed (pain).    . naloxegol oxalate (MOVANTIK) 12.5 MG TABS tablet Take 1 tablet (12.5 mg total) by mouth daily. (Patient not taking: Reported on 11/09/2016) 30 tablet 0  . naproxen sodium (ANAPROX) 220 MG tablet Take 220 mg by mouth daily as needed (pain).    . ranitidine (ZANTAC) 150 MG tablet Take 1 tablet (150 mg total) by mouth 2 (two) times daily. 60 tablet 0  . traMADol-acetaminophen (ULTRACET) 37.5-325 MG tablet Take 1 tablet by mouth every 6 (six) hours as needed. (Patient not taking: Reported on 02/01/2017) 30 tablet 0   No current facility-administered medications on file prior to visit.     Allergies  Allergen Reactions  . Azithromycin Shortness Of Breath and Swelling  . Claritin [Loratadine] Other (See Comments)    Discoloration of lips w/ swelling.  Marland Kitchen Penicillins Other (  See Comments)    Has patient had a PCN reaction causing immediate rash, facial/tongue/throat swelling, SOB or lightheadedness with hypotension: unknown Has patient had a PCN reaction causing severe rash involving mucus membranes or skin necrosis: unknown Has patient had a PCN reaction that required hospitalization unknown Has patient had a PCN reaction occurring within the last 10 years: yes If all of the above answers are "NO", then may proceed with  Cephalosporin use.      Objective:  BP 130/80   Pulse 80   Temp 98.7 F (37.1 C) (Oral)   Resp 16   Ht 5\' 4"  (1.626 m)   Wt 202 lb 3.2 oz (91.7 kg)   LMP 01/11/2017   SpO2 97%   BMI 34.71 kg/m   Physical Exam  Constitutional: She is oriented to person, place, and time and well-developed, well-nourished, and in no distress. Vital signs are normal.  Non-toxic appearance. She does not have a sickly appearance. No distress.  HENT:  Mouth/Throat: Uvula is midline, oropharynx is clear and moist and mucous membranes are normal.  Pt is speaking clearly and without distress. No drooling. No tripoding. No floor of mouth, buccal or dental abscess. She is not TTP in any area of her mouth. No drainage, erythema, swelling inside mouth.   Neck:    Cardiovascular: Normal rate, regular rhythm and normal heart sounds.  Pulmonary/Chest: Effort normal.  Neurological: She is alert and oriented to person, place, and time. GCS score is 15.  Skin: Skin is warm and dry.  Psychiatric: Mood, memory, affect and judgment normal.  Vitals reviewed.   Assessment and Plan :  1. Odontogenic infection of jaw 2. Mouth swelling 3. Foreign body sensation in throat - clindamycin (CLEOCIN) 150 MG capsule; Take 3 capsules (450 mg total) by mouth every 8 (eight) hours for 14 days.  Dispense: 126 capsule; Refill: 0 - POCT CBC - Ambulatory referral to ENT - pt c/o FB sensation of throat and drooling. She does not appear to be in distress and her vitals are normal. 2 cm soft nodule of submental space. Plan to cover for infection. RTC in 5 days for recheck - consider imaging if no improvement. Emergency department considerations discussed. Plan to refer to ENT in the case she does not improve.   Marco CollieWhitney Elke Holtry, PA-C  Primary Care at [facility] 02/03/2017 4:47 PM

## 2017-02-08 ENCOUNTER — Other Ambulatory Visit: Payer: Self-pay | Admitting: Podiatry

## 2017-02-08 ENCOUNTER — Ambulatory Visit: Payer: BC Managed Care – PPO | Admitting: Podiatry

## 2017-02-08 ENCOUNTER — Ambulatory Visit (INDEPENDENT_AMBULATORY_CARE_PROVIDER_SITE_OTHER): Payer: BC Managed Care – PPO

## 2017-02-08 ENCOUNTER — Encounter: Payer: Self-pay | Admitting: Podiatry

## 2017-02-08 DIAGNOSIS — M79672 Pain in left foot: Secondary | ICD-10-CM

## 2017-02-08 DIAGNOSIS — M7750 Other enthesopathy of unspecified foot: Secondary | ICD-10-CM | POA: Diagnosis not present

## 2017-02-08 DIAGNOSIS — Q828 Other specified congenital malformations of skin: Secondary | ICD-10-CM | POA: Diagnosis not present

## 2017-02-08 DIAGNOSIS — T148XXA Other injury of unspecified body region, initial encounter: Secondary | ICD-10-CM

## 2017-02-08 DIAGNOSIS — M779 Enthesopathy, unspecified: Secondary | ICD-10-CM

## 2017-02-09 NOTE — Progress Notes (Signed)
Subjective:   Patient ID: [name], female   DOB: 52 y.o.   MRN: [id number]   HPI Patient presents stating she has a spot on the bottom of her left foot that still sore and that on her left ankle she has had 6 months history of extreme pain in the left lateral ankle and feels muscle weakness with possibility that the muscle or tendon is not functioning properly.  States is been getting worse and making it hard to walk   ROS      Objective:  Physical Exam  Neurovascular status intact noted to be intact.  At this point I went ahead and I checked muscle strength of the peroneal and I found that there does appear to be moderate diminishment with the possibility of a longitudinal tear or possible split tear of the peroneal tendon as it comes behind the lateral malleolus     Assessment:  Strong possibility for tear of the peroneal tendon left with porokeratotic type lesion plantar left     Plan:  Due to the long-term condition I did go ahead and I am sending for MRI to rule out a tear or what type a tear of the peroneal tendon might have.  I then went ahead debrided the plantar lesion and applied padding around it and we will see this patient back and if we do do anything to the tendon will also excise the plantar lesion

## 2017-02-10 ENCOUNTER — Ambulatory Visit: Payer: BC Managed Care – PPO | Admitting: Physician Assistant

## 2017-02-14 ENCOUNTER — Other Ambulatory Visit: Payer: Self-pay | Admitting: Physician Assistant

## 2017-02-19 ENCOUNTER — Ambulatory Visit
Admission: RE | Admit: 2017-02-19 | Discharge: 2017-02-19 | Disposition: A | Discharge status: Home / Self Care | Payer: BC Managed Care – PPO | Source: Ambulatory Visit | Attending: Podiatry | Admitting: Podiatry

## 2017-02-22 ENCOUNTER — Encounter: Payer: Self-pay | Admitting: Podiatry

## 2017-02-22 ENCOUNTER — Ambulatory Visit: Payer: BC Managed Care – PPO | Admitting: Podiatry

## 2017-02-22 DIAGNOSIS — M79672 Pain in left foot: Secondary | ICD-10-CM

## 2017-02-22 DIAGNOSIS — M779 Enthesopathy, unspecified: Secondary | ICD-10-CM

## 2017-02-22 MED ORDER — DICLOFENAC SODIUM 75 MG PO TBEC: 75.0000 mg | DELAYED_RELEASE_TABLET | Freq: Two times a day (BID) | ORAL | 2 refills | Status: DC

## 2017-02-22 MED ORDER — TRIAMCINOLONE ACETONIDE 10 MG/ML IJ SUSP
10.0000 mg | Freq: Once | INTRAMUSCULAR | Status: AC
  Administered 2017-02-22: 10 mg

## 2017-02-22 NOTE — Progress Notes (Signed)
Subjective:   Patient ID: [name], female   DOB: 52 y.o.   MRN: [id number]   HPI Patient presents stating the outside of the ankle is still hurting and at times feels unstable and she wants to go over MRI   ROS      Objective:  Physical Exam  Neurovascular status intact with quite a bit of discomfort in the lateral peroneal tendon complex left with mild edema and pain also slightly on the medial plantar fascial band     Assessment:  Peroneal tendinitis left with no indication currently of tear F2 reviewed findings indicating no indication of tear of the peroneal tendon and did careful sheath injection lateral side left 3 mg dexamethasone Kenalog 5 mg Xylocaine and applied air fracture walker to completely immobilize allowing her to rest and hopefully this will solve the problem     Plan:  Above recommendation given to patient

## 2017-03-09 ENCOUNTER — Other Ambulatory Visit: Payer: Self-pay

## 2017-03-09 DIAGNOSIS — M25562 Pain in left knee: Principal | ICD-10-CM

## 2017-03-09 DIAGNOSIS — M25561 Pain in right knee: Secondary | ICD-10-CM

## 2017-03-09 MED ORDER — MELOXICAM 15 MG PO TABS: 15.0000 mg | ORAL_TABLET | Freq: Every day | ORAL | 1 refills | Status: DC

## 2017-03-09 NOTE — Telephone Encounter (Signed)
Phone call to patient. She states that issue she was referred to ENT for was resolved by antibiotics, this is the reason why she cancelled her ENT appointment.   While on the call, patient is requesting a refill of her meloxicam. She is requesting this due to increased finger and arm cramping. Unable to refill during call, cannot find supporting documentation where medication originally prescribed. Pharmacy and allergies confirmed.   Medication pended for provider approval, please advise.

## 2017-03-09 NOTE — Telephone Encounter (Signed)
Copied from CRM [id number]. Topic: General - Other >> Mar 08, 2017 10:35 AM Maia Pettiesrtiz, Kristie S wrote: Reason for CRM: requesting call back to discuss going to ENT prior to her appt, may cancel appt

## 2017-03-14 ENCOUNTER — Ambulatory Visit: Payer: BC Managed Care – PPO | Admitting: Allergy and Immunology

## 2017-03-15 ENCOUNTER — Ambulatory Visit: Payer: BC Managed Care – PPO | Admitting: Podiatry

## 2017-03-18 ENCOUNTER — Other Ambulatory Visit: Payer: Self-pay | Admitting: Physician Assistant

## 2017-03-20 NOTE — Telephone Encounter (Signed)
Refill request for Zyrtec  LOV 02/03/17 with Cassidy ShoutsElizabeth Davis.     Was for follow up appt on 02/10/17 but was cancelled by pt.    Has an appt with [name] on 04/12/17 allergist.   [phone] [facility] [address] [place], [place]

## 2017-04-12 ENCOUNTER — Ambulatory Visit: Payer: BC Managed Care – PPO | Admitting: Allergy and Immunology

## 2017-04-18 ENCOUNTER — Other Ambulatory Visit: Payer: Self-pay | Admitting: Physician Assistant

## 2017-05-03 ENCOUNTER — Encounter: Payer: Self-pay | Admitting: Physician Assistant

## 2017-05-17 ENCOUNTER — Other Ambulatory Visit: Payer: Self-pay | Admitting: Physician Assistant

## 2017-05-25 ENCOUNTER — Other Ambulatory Visit: Payer: Self-pay | Admitting: Emergency Medicine

## 2017-05-25 NOTE — Telephone Encounter (Signed)
Needs OV for furthur refills

## 2017-05-31 ENCOUNTER — Encounter: Payer: Self-pay | Admitting: Physician Assistant

## 2017-06-06 ENCOUNTER — Encounter: Payer: Self-pay | Admitting: Physician Assistant

## 2017-06-06 ENCOUNTER — Ambulatory Visit: Payer: BC Managed Care – PPO | Admitting: Physician Assistant

## 2017-06-06 ENCOUNTER — Other Ambulatory Visit: Payer: Self-pay

## 2017-06-06 VITALS — BP 128/72 | HR 78 | Temp 98.5°F | Resp 16 | Ht 63.75 in | Wt 205.4 lb

## 2017-06-06 DIAGNOSIS — M549 Dorsalgia, unspecified: Secondary | ICD-10-CM | POA: Diagnosis not present

## 2017-06-06 DIAGNOSIS — G8929 Other chronic pain: Secondary | ICD-10-CM | POA: Diagnosis not present

## 2017-06-06 DIAGNOSIS — M797 Fibromyalgia: Secondary | ICD-10-CM | POA: Diagnosis not present

## 2017-06-06 MED ORDER — SALICYLIC ACID 26 % EX LIQD: 1.0000 "application " | Freq: Two times a day (BID) | CUTANEOUS | 1 refills | Status: DC

## 2017-06-06 MED ORDER — FLUOXETINE HCL 20 MG PO TABS: 20.0000 mg | ORAL_TABLET | Freq: Every day | ORAL | 3 refills | Status: DC

## 2017-06-06 NOTE — Progress Notes (Signed)
[name]  MRN: [id number] DOB: 1965-08-23  PCP: Olivia Mackie, MD  Subjective:  Pt is a 52 year old female who presents to clinic for back pain.  She is concerned today because she has not had any improvement after years of seeking help.  Pain is daily, worse with sitting for long periods of time and walking.  Pain is affecting her daily life. She does have a history of PTSD secondary to events which happened to her while in the [employer] She plans to start seeking care over at the [place].  Review of Systems  Genitourinary: Negative for dysuria, frequency, urgency, vaginal bleeding, vaginal discharge and vaginal pain.  Musculoskeletal: Positive for back pain. Negative for neck pain and neck stiffness.    Patient Active Problem List   Diagnosis Date Noted  . Essential hypertension 05/05/2016  . Chronic arthralgias of knees and hips 05/05/2016  . Cervical disc disorder with radiculopathy of cervical region 12/01/2015  . Tobacco user 07/18/2013  . Obesity, unspecified 07/18/2013  . Seasonal allergies 07/18/2013  . S/P cervical spinal fusion 02/27/2012    Current Outpatient Medications on File Prior to Visit  Medication Sig Dispense Refill  . amLODipine (NORVASC) 5 MG tablet TAKE 1 TABLET(5 MG) BY MOUTH DAILY 30 tablet 0  . cetirizine (ZYRTEC) 10 MG tablet TAKE 1 TABLET(10 MG) BY MOUTH DAILY 30 tablet 0  . levonorgestrel (MIRENA) 20 MCG/24HR IUD 1 each by Intrauterine route once.    . meloxicam (MOBIC) 15 MG tablet Take 1 tablet (15 mg total) by mouth daily. 30 tablet 1  . traMADol-acetaminophen (ULTRACET) 37.5-325 MG tablet Take 1 tablet by mouth every 6 (six) hours as needed. 30 tablet 0  . acetaminophen (TYLENOL) 650 MG CR tablet Take 650 mg by mouth every 8 (eight) hours as needed for pain.    . cyclobenzaprine (AMRIX) 15 MG 24 hr capsule Take 1 capsule (15 mg total) by mouth daily as needed for muscle spasms. Max dose /day (Patient not taking: Reported on 06/06/2017) 30  capsule 0  . cyclobenzaprine (FLEXERIL) 10 MG tablet Take 1 tablet (10 mg total) by mouth 3 (three) times daily as needed for muscle spasms. (Patient not taking: Reported on 06/06/2017) 30 tablet 1  . diclofenac (VOLTAREN) 75 MG EC tablet Take 1 tablet (75 mg total) by mouth 2 (two) times daily. (Patient not taking: Reported on 06/06/2017) 50 tablet 2  . DULoxetine (CYMBALTA) 20 MG capsule Take 1 capsule (20 mg total) by mouth daily. (Patient not taking: Reported on 06/06/2017) 60 capsule 1  . gabapentin (NEURONTIN) 300 MG capsule TAKE ONE CAPSULE BY MOUTH EVERY NIGHT AT BEDTIME FOR 3 DAYS, 1 CAPSULE TWICE DAILY FOR 3 DAYS, THEN 1 CAPSULE THREE TIMES DAILY AFTERWARDS (Patient not taking: Reported on 06/06/2017) 90 capsule 0  . Menthol, Topical Analgesic, (BENGAY EX) Apply 1 application topically daily as needed (pain).    . naloxegol oxalate (MOVANTIK) 12.5 MG TABS tablet Take 1 tablet (12.5 mg total) by mouth daily. (Patient not taking: Reported on 06/06/2017) 30 tablet 0  . naproxen sodium (ANAPROX) 220 MG tablet Take 220 mg by mouth daily as needed (pain).    . ranitidine (ZANTAC) 150 MG tablet Take 1 tablet (150 mg total) by mouth 2 (two) times daily. (Patient not taking: Reported on 06/06/2017) 60 tablet 0   No current facility-administered medications on file prior to visit.     Allergies  Allergen Reactions  . Azithromycin Shortness Of Breath and Swelling  .  Claritin [Loratadine] Other (See Comments)    Discoloration of lips w/ swelling.  Marland Kitchen Penicillins Other (See Comments)    Has patient had a PCN reaction causing immediate rash, facial/tongue/throat swelling, SOB or lightheadedness with hypotension: unknown Has patient had a PCN reaction causing severe rash involving mucus membranes or skin necrosis: unknown Has patient had a PCN reaction that required hospitalization unknown Has patient had a PCN reaction occurring within the last 10 years: yes If all of the above answers are "NO", then may  proceed with Cephalosporin use.      Objective:  BP 128/72 (BP Location: Left Arm, Patient Position: Sitting, Cuff Size: Normal)   Pulse 78   Temp 98.5 F (36.9 C) (Oral)   Resp 16   Ht 5' 3.75" (1.619 m)   Wt 205 lb 6.4 oz (93.2 kg)   SpO2 99%   BMI 35.53 kg/m   Physical Exam  Constitutional: She is oriented to person, place, and time. No distress.  Cardiovascular: Normal rate, regular rhythm and normal heart sounds.  Musculoskeletal:       Cervical back: She exhibits tenderness. She exhibits no bony tenderness.       Thoracic back: She exhibits tenderness. She exhibits no bony tenderness.       Lumbar back: She exhibits tenderness. She exhibits no bony tenderness.  Neurological: She is alert and oriented to person, place, and time.  Skin: Skin is warm and dry.  Psychiatric: Judgment normal.  Vitals reviewed.   Assessment and Plan :  1. Fibromyalgia 2. Chronic bilateral back pain, unspecified back location -Patient continues to experience chronic back pain.  She plans to start seeking care at the [place].  She has not yet started fluoxetine which was prescribed earlier.  Advised patient start fluoxetine especially considering her history of PTSD.  Return to clinic in 4 weeks to recheck.  Marco Collie, PA-C  Primary Care at [facility] 06/06/2017 3:03 PM

## 2017-06-06 NOTE — Patient Instructions (Addendum)
Start taking Fluoxetine 20 mg once daily; if no improvement after 2 weeks, you may increase the dose to 30 mg/daily.   Come back and see me in 4 weeks. If you are not feeling improvement, we will add on another medication that may help. (sometimes two medications work in combo better than one)   Duct tape. Using duct tape to remove warts is a harmless approach. To try it, cover the wart with silver duct tape, changing it every few days. Between applications, soak the wart and gently remove dead tissue with a pumice stone or emery board. Then leave the wart open to the air to dry for a few hours before covering it with tape again.  Or you can try topical Salicylic acid: I sent in Rx for liquid Salicylic acid 00% -- if this is too expensive buy an over the counter application (see below) Adults: Soak wart in warm water for 5 minutes. Dry area thoroughly. Apply to entire wart surface, allow to dry, and then apply a second time. Avoid contact with surrounding skin. Continue therapy once or twice daily. Resolution may be expected after 4 to 6 weeks; some warts may take longer to remove.  Liquid: Salicylic acid 17%: (Over THE COUNTER): Soak wart in warm water for 5 minutes. Dry area thoroughly. Apply 1 drop to cover wart. Let dry. Repeat once or twice daily until wart is removed for up to 12 weeks. Pads: Salicylic acid 49%: (Over THE COUNTER): Soak wart in warm water for 5 minutes. Dry area thoroughly. Apply medicated pad directly over wart and secure firmly to skin. Repeat every 48 hours as needed until wart is removed for up to 12 weeks.    Fluoxetine capsules or tablets (Depression/Mood Disorders) What is this medicine? FLUOXETINE (floo OX e teen) belongs to a class of drugs known as selective serotonin reuptake inhibitors (SSRIs). It helps to treat mood problems such as depression, pain disorders, obsessive compulsive disorder, and panic attacks. It can also treat certain eating disorders. This  medicine may be used for other purposes; ask your health care provider or pharmacist if you have questions. COMMON BRAND NAME(S): Prozac How should I use this medicine? Take this medicine by mouth with a glass of water. Follow the directions on the prescription label. You can take this medicine with or without food. Take your medicine at regular intervals. Do not take it more often than directed. Do not stop taking this medicine suddenly except upon the advice of your doctor. Stopping this medicine too quickly may cause serious side effects or your condition may worsen. A special MedGuide will be given to you by the pharmacist with each prescription and refill. Be sure to read this information carefully each time. Talk to your pediatrician regarding the use of this medicine in children. While this drug may be prescribed for children as young as 7 years for selected conditions, precautions do apply. Overdosage: If you think you have taken too much of this medicine contact a poison control center or emergency room at once. NOTE: This medicine is only for you. Do not share this medicine with others. What if I miss a dose? If you miss a dose, skip the missed dose and go back to your regular dosing schedule. Do not take double or extra doses. What may interact with this medicine? Do not take this medicine with any of the following medications: -other medicines containing fluoxetine, like Sarafem or Symbyax -cisapride -linezolid -MAOIs like Carbex, Eldepryl, Marplan, Nardil, and  Parnate -methylene blue (injected into a vein) -pimozide -thioridazine This medicine may also interact with the following medications: -alcohol -amphetamines -aspirin and aspirin-like medicines -carbamazepine -certain medicines for depression, anxiety, or psychotic disturbances -certain medicines for migraine headaches like almotriptan, eletriptan, frovatriptan, naratriptan, rizatriptan, sumatriptan,  zolmitriptan -digoxin -diuretics -fentanyl -flecainide -furazolidone -isoniazid -lithium -medicines for sleep -medicines that treat or prevent blood clots like warfarin, enoxaparin, and dalteparin -NSAIDs, medicines for pain and inflammation, like ibuprofen or naproxen -phenytoin -procarbazine -propafenone -rasagiline -ritonavir -supplements like St. John's wort, kava kava, valerian -tramadol -tryptophan -vinblastine This list may not describe all possible interactions. Give your health care provider a list of all the medicines, herbs, non-prescription drugs, or dietary supplements you use. Also tell them if you smoke, drink alcohol, or use illegal drugs. Some items may interact with your medicine. What should I watch for while using this medicine? Tell your doctor if your symptoms do not get better or if they get worse. Visit your doctor or health care professional for regular checks on your progress. Because it may take several weeks to see the full effects of this medicine, it is important to continue your treatment as prescribed by your doctor. Patients and their families should watch out for new or worsening thoughts of suicide or depression. Also watch out for sudden changes in feelings such as feeling anxious, agitated, panicky, irritable, hostile, aggressive, impulsive, severely restless, overly excited and hyperactive, or not being able to sleep. If this happens, especially at the beginning of treatment or after a change in dose, call your health care professional. Dennis Bast may get drowsy or dizzy. Do not drive, use machinery, or do anything that needs mental alertness until you know how this medicine affects you. Do not stand or sit up quickly, especially if you are an older patient. This reduces the risk of dizzy or fainting spells. Alcohol may interfere with the effect of this medicine. Avoid alcoholic drinks. Your mouth may get dry. Chewing sugarless gum or sucking hard candy, and  drinking plenty of water may help. Contact your doctor if the problem does not go away or is severe. This medicine may affect blood sugar levels. If you have diabetes, check with your doctor or health care professional before you change your diet or the dose of your diabetic medicine. What side effects may I notice from receiving this medicine? Side effects that you should report to your doctor or health care professional as soon as possible: -allergic reactions like skin rash, itching or hives, swelling of the face, lips, or tongue -anxious -black, tarry stools -breathing problems -changes in vision -confusion -elevated mood, decreased need for sleep, racing thoughts, impulsive behavior -eye pain -fast, irregular heartbeat -feeling faint or lightheaded, falls -feeling agitated, angry, or irritable -hallucination, loss of contact with reality -loss of balance or coordination -loss of memory -painful or prolonged erections -restlessness, pacing, inability to keep still -seizures -stiff muscles -suicidal thoughts or other mood changes -trouble sleeping -unusual bleeding or bruising -unusually weak or tired -vomiting Side effects that usually do not require medical attention (report to your doctor or health care professional if they continue or are bothersome): -change in appetite or weight -change in sex drive or performance -diarrhea -dry mouth -headache -increased sweating -nausea -tremors This list may not describe all possible side effects. Call your doctor for medical advice about side effects. You may report side effects to FDA at 1-800-FDA-1088. Where should I keep my medicine? Keep out of the reach of children. Store at  room temperature between 15 and 30 degrees C (59 and 86 degrees F). Throw away any unused medicine after the expiration date. NOTE: This sheet is a summary. It may not cover all possible information. If you have questions about this medicine, talk to your  doctor, pharmacist, or health care provider.  2018 Elsevier/Gold Standard (2015-06-13 15:55:27)  Thank you for coming in today. I hope you feel we met your needs.  Feel free to call PCP if you have any questions or further requests.  Please consider signing up for MyChart if you do not already have it, as this is a great way to communicate with me.  Best,  [name] McVey, PA-C  IF you received an x-ray today, you will receive an invoice from [facility]. Please contact [facility] at [phone] with questions or concerns regarding your invoice.   IF you received labwork today, you will receive an invoice from [facility]. Please contact [facility] at [phone] with questions or concerns regarding your invoice.   Our billing staff will not be able to assist you with questions regarding bills from these companies.  You will be contacted with the lab results as soon as they are available. The fastest way to get your results is to activate your My Chart account. Instructions are located on the last page of this paperwork. If you have not heard from [place] regarding the results in 2 weeks, please contact this office.

## 2017-06-17 ENCOUNTER — Other Ambulatory Visit: Payer: Self-pay | Admitting: Physician Assistant

## 2017-06-20 NOTE — Telephone Encounter (Signed)
cetirizine refill Last OV: 02/01/17 (next appt 07/07/17) Last Refill:05/17/17 #30 tab No RF Pharmacy:[facility] [id number] [facility] PCP: Marco Collie PA-C

## 2017-06-27 ENCOUNTER — Other Ambulatory Visit: Payer: Self-pay | Admitting: Emergency Medicine

## 2017-07-07 ENCOUNTER — Encounter: Payer: Self-pay | Admitting: Physician Assistant

## 2017-07-07 ENCOUNTER — Ambulatory Visit (INDEPENDENT_AMBULATORY_CARE_PROVIDER_SITE_OTHER): Payer: BC Managed Care – PPO | Admitting: Physician Assistant

## 2017-07-07 ENCOUNTER — Other Ambulatory Visit: Payer: Self-pay

## 2017-07-07 VITALS — BP 128/80 | HR 68 | Temp 99.2°F | Resp 16 | Ht 63.75 in | Wt 208.0 lb

## 2017-07-07 DIAGNOSIS — M5010 Cervical disc disorder with radiculopathy, unspecified cervical region: Secondary | ICD-10-CM

## 2017-07-07 DIAGNOSIS — Z23 Encounter for immunization: Secondary | ICD-10-CM

## 2017-07-07 MED ORDER — TETANUS-DIPHTH-ACELL PERTUSSIS 5-2.5-18.5 LF-MCG/0.5 IM SUSP: 0.5000 mL | Freq: Once | INTRAMUSCULAR | Status: AC

## 2017-07-07 NOTE — Patient Instructions (Addendum)
  Start taking Fluoxetine 20 mg once daily;  if no improvement after 2 weeks, you may increase the dose to 30 mg/daily;   if no improvement after 2 weeks, you may increase the dose to 40 mg/daily.    Come back and see me in 4 weeks. (Or after [facility]).  If you are not feeling improvement, we will add on another medication that may help. (sometimes two medications work in combo better than one)  Thank you for coming in today. I hope you feel we met your needs.  Feel free to call PCP if you have any questions or further requests.  Please consider signing up for MyChart if you do not already have it, as this is a great way to communicate with me.  Best,  [name] McVey, PA-C   IF you received an x-ray today, you will receive an invoice from [facility]. Please contact [facility] at [phone] with questions or concerns regarding your invoice.   IF you received labwork today, you will receive an invoice from [place]. Please contact [facility] at [phone] with questions or concerns regarding your invoice.   Our billing staff will not be able to assist you with questions regarding bills from these companies.  You will be contacted with the lab results as soon as they are available. The fastest way to get your results is to activate your My Chart account. Instructions are located on the last page of this paperwork. If you have not heard from [place] regarding the results in 2 weeks, please contact this office.

## 2017-07-07 NOTE — Progress Notes (Unsigned)
[name]  MRN: [id number] DOB: [birth year]  PCP: Olivia Mackieaavon, Richard, MD  Subjective:  Pt is a 52 year old presents to clinic for follow-up back pain.  This is a chronic and nonresolving problem for her.  At her last few OV we discussed possible complication of pain due to PTSD. She agrees this is possible.   She started Fluoxetine 20mg  as directed.  This made her feel horrible with major side effects which lasted about a week. "I wanted to die" Endorses diarrhea, fatigue, sweats, vomiting.  "I don't want to take any medication anymore".  CBD oil is helping more than anything.  Tramadol at night - knocks her out and helps her sleep through any back pain.   She has her first [facility] app[facility] next month.   Foot pain -plantar wart on the bottom of her foot continues to give her problems.  She feels like she is walking funny which is causing her to compensate which is causing increased knee pain, hip pain, back pain.  She cannot find any relief for her foot.  Review of Systems  Genitourinary: Negative for dysuria, flank pain, frequency and urgency.  Musculoskeletal: Positive for arthralgias and back pain.  Skin: Positive for wound (Plantar wart).    Patient Active Problem List   Diagnosis Date Noted  . Essential hypertension 05/05/2016  . Chronic arthralgias of knees and hips 05/05/2016  . Cervical disc disorder with radiculopathy of cervical region 12/01/2015  . Tobacco user 07/18/2013  . Obesity, unspecified 07/18/2013  . Seasonal allergies 07/18/2013  . S/P cervical spinal fusion 02/27/2012    Current Outpatient Medications on File Prior to Visit  Medication Sig Dispense Refill  . cetirizine (ZYRTEC) 10 MG tablet TAKE 1 TABLET(10 MG) BY MOUTH DAILY 30 tablet 0  . cyclobenzaprine (AMRIX) 15 MG 24 hr capsule Take 1 capsule (15 mg total) by mouth daily as needed for muscle spasms. Max dose 30mg /day 30 capsule 0  . diclofenac (VOLTAREN) 75 MG EC tablet Take 1 tablet (75  mg total) by mouth 2 (two) times daily. 50 tablet 2  . DULoxetine (CYMBALTA) 20 MG capsule Take 1 capsule (20 mg total) by mouth daily. 60 capsule 1  . FLUoxetine (PROZAC) 20 MG tablet Take 1 tablet (20 mg total) by mouth daily. 30 tablet 3  . levonorgestrel (MIRENA) 20 MCG/24HR IUD 1 each by Intrauterine route once.    . meloxicam (MOBIC) 15 MG tablet Take 1 tablet (15 mg total) by mouth daily. 30 tablet 1  . Menthol, Topical Analgesic, (BENGAY EX) Apply 1 application topically daily as needed (pain).    . naloxegol oxalate (MOVANTIK) 12.5 MG TABS tablet Take 1 tablet (12.5 mg total) by mouth daily. 30 tablet 0  . naproxen sodium (ANAPROX) 220 MG tablet Take 220 mg by mouth daily as needed (pain).    . ranitidine (ZANTAC) 150 MG tablet Take 1 tablet (150 mg total) by mouth 2 (two) times daily. 60 tablet 0  . Salicylic Acid 26 % LIQD Apply 1 application topically 2 (two) times daily. Soak wart in warm water x 5min. Dry. Apply to entire wart, allow to dry, then apply again 10 mL 1  . traMADol-acetaminophen (ULTRACET) 37.5-325 MG tablet Take 1 tablet by mouth every 6 (six) hours as needed. 30 tablet 0  . acetaminophen (TYLENOL) 650 MG CR tablet Take 650 mg by mouth every 8 (eight) hours as needed for pain.    Marland Kitchen. amLODipine (NORVASC) 5 MG tablet TAKE  1 TABLET(5 MG) BY MOUTH DAILY (Patient not taking: Reported on 07/07/2017) 30 tablet 0  . cyclobenzaprine (FLEXERIL) 10 MG tablet Take 1 tablet (10 mg total) by mouth 3 (three) times daily as needed for muscle spasms. (Patient not taking: Reported on 06/06/2017) 30 tablet 1  . gabapentin (NEURONTIN) 300 MG capsule TAKE ONE CAPSULE BY MOUTH EVERY NIGHT AT BEDTIME FOR 3 DAYS, 1 CAPSULE TWICE DAILY FOR 3 DAYS, THEN 1 CAPSULE THREE TIMES DAILY AFTERWARDS (Patient not taking: Reported on 06/06/2017) 90 capsule 0   No current facility-administered medications on file prior to visit.     Allergies  Allergen Reactions  . Azithromycin Shortness Of Breath and  Swelling  . Claritin [Loratadine] Other (See Comments)    Discoloration of lips w/ swelling.  Marland Kitchen Penicillins Other (See Comments)    Has patient had a PCN reaction causing immediate rash, facial/tongue/throat swelling, SOB or lightheadedness with hypotension: unknown Has patient had a PCN reaction causing severe rash involving mucus membranes or skin necrosis: unknown Has patient had a PCN reaction that required hospitalization unknown Has patient had a PCN reaction occurring within the last 10 years: yes If all of the above answers are "NO", then may proceed with Cephalosporin use.      Objective:  BP 128/80 (BP Location: Left Arm, Patient Position: Sitting, Cuff Size: Normal)   Pulse 68   Temp 99.2 F (37.3 C) (Oral)   Resp 16   Ht 5' 3.75" (1.619 m)   Wt 208 lb (94.3 kg)   LMP 06/26/2017   SpO2 98%   BMI 35.98 kg/m   Physical Exam  Constitutional: She is oriented to person, place, and time. No distress.  Neurological: She is alert and oriented to person, place, and time.  Skin: Skin is warm and dry.  Plantar wart  Psychiatric: Judgment normal.  Vitals reviewed.   Assessment and Plan :  1. Cervical disc disorder with radiculopathy of cervical region -Patient continues to complain of back pain. Suspect possible complication in pain due to PTSD.  She experienced horrible SE with Fluoxetine and is not willing to start another medication at this time. She plans to get help from the [place], her first appointment is next month. F/u with me as needed.    2. Need for Tdap vaccination - Administered by CMA - Tdap (BOOSTRIX) injection 0.5 mL  Marco Collie, PA-C  Primary Care at [facility] 07/07/2017 3:18 PM

## 2017-07-10 ENCOUNTER — Encounter: Payer: Self-pay | Admitting: Physician Assistant

## 2017-07-12 ENCOUNTER — Encounter: Payer: Self-pay | Admitting: Physician Assistant

## 2017-07-12 ENCOUNTER — Ambulatory Visit (INDEPENDENT_AMBULATORY_CARE_PROVIDER_SITE_OTHER): Payer: BC Managed Care – PPO | Admitting: Physician Assistant

## 2017-07-12 ENCOUNTER — Ambulatory Visit: Payer: Self-pay

## 2017-07-12 VITALS — BP 130/83 | HR 65 | Temp 98.5°F | Resp 17 | Ht 64.0 in | Wt 204.0 lb

## 2017-07-12 DIAGNOSIS — R221 Localized swelling, mass and lump, neck: Secondary | ICD-10-CM

## 2017-07-12 DIAGNOSIS — Z713 Dietary counseling and surveillance: Secondary | ICD-10-CM

## 2017-07-12 DIAGNOSIS — E669 Obesity, unspecified: Secondary | ICD-10-CM | POA: Diagnosis not present

## 2017-07-12 MED ORDER — METHYLPREDNISOLONE SODIUM SUCC 125 MG IJ SOLR
62.5000 mg | Freq: Once | INTRAMUSCULAR | Status: AC
Start: 2017-07-12 — End: 2017-07-12
  Administered 2017-07-12: 62.5 mg via INTRAMUSCULAR

## 2017-07-12 MED ORDER — PHENTERMINE HCL 15 MG PO CAPS: 15.0000 mg | ORAL_CAPSULE | ORAL | 2 refills | Status: AC

## 2017-07-12 NOTE — Progress Notes (Signed)
[name]  MRN: [id number] DOB: 1965-02-13  PCP: Olivia Mackie, MD  Subjective:  Pt is a pleasant 52 year old female who presents to clinic for problem with medications.  She recently started Prozac for PTSD. The next day she endorses throat swelling, vomiting, diarrhea, body aches. She is no longer taking this.  [facility].  Obesity - she is having a difficult time losing weight as she cannot exercise due to back, knee and foot pain.  She is eating healthy diet. Today she weighs 204 lbs.     Review of Systems  Constitutional: Negative for chills, diaphoresis, fatigue and fever.  HENT: Positive for sore throat and trouble swallowing. Negative for congestion, postnasal drip, rhinorrhea, sinus pressure and sneezing.   Respiratory: Negative for cough, chest tightness, shortness of breath and wheezing.   Cardiovascular: Negative for chest pain and palpitations.  Gastrointestinal: Positive for diarrhea, nausea and vomiting. Negative for abdominal pain.  Neurological: Positive for headaches. Negative for weakness and light-headedness.    Patient Active Problem List   Diagnosis Date Noted  . Essential hypertension 05/05/2016  . Chronic arthralgias of knees and hips 05/05/2016  . Cervical disc disorder with radiculopathy of cervical region 12/01/2015  . Tobacco user 07/18/2013  . Obesity, unspecified 07/18/2013  . Seasonal allergies 07/18/2013  . S/P cervical spinal fusion 02/27/2012    Current Outpatient Medications on File Prior to Visit  Medication Sig Dispense Refill  . cetirizine (ZYRTEC) 10 MG tablet TAKE 1 TABLET(10 MG) BY MOUTH DAILY 30 tablet 0  . acetaminophen (TYLENOL) 650 MG CR tablet Take 650 mg by mouth every 8 (eight) hours as needed for pain.    Marland Kitchen amLODipine (NORVASC) 5 MG tablet TAKE 1 TABLET(5 MG) BY MOUTH DAILY (Patient not taking: Reported on 07/07/2017) 30 tablet 0  . cyclobenzaprine (AMRIX) 15 MG 24 hr capsule Take 1 capsule (15  mg total) by mouth daily as needed for muscle spasms. Max dose 30mg /day (Patient not taking: Reported on 07/12/2017) 30 capsule 0  . cyclobenzaprine (FLEXERIL) 10 MG tablet Take 1 tablet (10 mg total) by mouth 3 (three) times daily as needed for muscle spasms. (Patient not taking: Reported on 06/06/2017) 30 tablet 1  . diclofenac (VOLTAREN) 75 MG EC tablet Take 1 tablet (75 mg total) by mouth 2 (two) times daily. (Patient not taking: Reported on 07/12/2017) 50 tablet 2  . DULoxetine (CYMBALTA) 20 MG capsule Take 1 capsule (20 mg total) by mouth daily. (Patient not taking: Reported on 07/12/2017) 60 capsule 1  . FLUoxetine (PROZAC) 20 MG tablet Take 1 tablet (20 mg total) by mouth daily. (Patient not taking: Reported on 07/12/2017) 30 tablet 3  . gabapentin (NEURONTIN) 300 MG capsule TAKE ONE CAPSULE BY MOUTH EVERY NIGHT AT BEDTIME FOR 3 DAYS, 1 CAPSULE TWICE DAILY FOR 3 DAYS, THEN 1 CAPSULE THREE TIMES DAILY AFTERWARDS (Patient not taking: Reported on 06/06/2017) 90 capsule 0  . levonorgestrel (MIRENA) 20 MCG/24HR IUD 1 each by Intrauterine route once.    . meloxicam (MOBIC) 15 MG tablet Take 1 tablet (15 mg total) by mouth daily. (Patient not taking: Reported on 07/12/2017) 30 tablet 1  . Menthol, Topical Analgesic, (BENGAY EX) Apply 1 application topically daily as needed (pain).    . naloxegol oxalate (MOVANTIK) 12.5 MG TABS tablet Take 1 tablet (12.5 mg total) by mouth daily. (Patient not taking: Reported on 07/12/2017) 30 tablet 0  . naproxen sodium (ANAPROX) 220 MG tablet Take  220 mg by mouth daily as needed (pain).    . ranitidine (ZANTAC) 150 MG tablet Take 1 tablet (150 mg total) by mouth 2 (two) times daily. (Patient not taking: Reported on 07/12/2017) 60 tablet 0  . Salicylic Acid 26 % LIQD Apply 1 application topically 2 (two) times daily. Soak wart in warm water x 5min. Dry. Apply to entire wart, allow to dry, then apply again (Patient not taking: Reported on 07/12/2017) 10 mL 1  .  traMADol-acetaminophen (ULTRACET) 37.5-325 MG tablet Take 1 tablet by mouth every 6 (six) hours as needed. (Patient not taking: Reported on 07/12/2017) 30 tablet 0   Current Facility-Administered Medications on File Prior to Visit  Medication Dose Route Frequency Provider Last Rate Last Dose  . Tdap (BOOSTRIX) injection 0.5 mL  0.5 mL Intramuscular Once Joclynn Lumb, Madelaine BhatElizabeth [name], PA-C        Allergies  Allergen Reactions  . Azithromycin Shortness Of Breath and Swelling  . Claritin [Loratadine] Other (See Comments)    Discoloration of lips w/ swelling.  Marland Kitchen. Penicillins Other (See Comments)    Has patient had a PCN reaction causing immediate rash, facial/tongue/throat swelling, SOB or lightheadedness with hypotension: unknown Has patient had a PCN reaction causing severe rash involving mucus membranes or skin necrosis: unknown Has patient had a PCN reaction that required hospitalization unknown Has patient had a PCN reaction occurring within the last 10 years: yes If all of the above answers are "NO", then may proceed with Cephalosporin use.      Objective:  BP 130/83   Pulse 65   Temp 98.5 F (36.9 C) (Oral)   Resp 17   Ht 5\' 4"  (1.626 m)   Wt 204 lb (92.5 kg)   LMP 06/26/2017   SpO2 98%   BMI 35.02 kg/m   Physical Exam  Constitutional: She is oriented to person, place, and time. No distress.  HENT:  Mouth/Throat: Uvula is midline, oropharynx is clear and moist and mucous membranes are normal.  Cardiovascular: Normal rate, regular rhythm and normal heart sounds.  Neurological: She is alert and oriented to person, place, and time.  Skin: Skin is warm and dry.  Psychiatric: Judgment normal.  Vitals reviewed.   Assessment and Plan :  1. Throat swelling - pt presents c/o medication reaction to Prozac. C/o throat swelling. She does not appear to be in distress at this time. Injection of solumedrol administered by CMA. Take Benadryl 50mg  every 4-6 hrs x 2 days. Then take Zantac  AND zyrtec as prescribed x 5 days. - methylPREDNISolone sodium succinate (SOLU-MEDROL) 125 mg/2 mL injection 62.5 mg  2. Obesity (BMI 35.0-39.9 without comorbidity) 3. Weight loss counseling, encounter for - weight today is 204. She would like to lose 50lbs, She has a diet planned. She is unable to exercise 2/2 back, knee and foot pain. Medication side effects discussed with pt. RTC in 2 months to f/u.  - phentermine 15 MG capsule; Take 1 capsule (15 mg total) by mouth every morning.  Dispense: 30 capsule; Refill: 2   Marco CollieWhitney Renate Danh, PA-C  [facility] 07/12/2017 1:48 PM

## 2017-07-12 NOTE — Telephone Encounter (Signed)
Pt. Reports after taking Fluoxetine Monday, she developed a sore throat, felt like throat was swollen, diarrhea. Sent an e-mail to provider. Was instructed to go to ED. Did not go. Took Benadryl and reports it helped. Throat does not feel swollen - "still scratchy and my lymph nodes are swollen." Denies difficulty swallowing or shortness of breath. States she stayed home yesterday and rested. Appointment made for today with her provider. Reason for Disposition . Caller has URGENT medication question about med that PCP prescribed and triager unable to answer question  Answer Assessment - Initial Assessment Questions 1. SYMPTOMS: "Do you have any symptoms?"     Monday after taking Prozac, throat felt swollen, had diarrhea. 2. SEVERITY: If symptoms are present, ask "Are they mild, moderate or severe?"     Took Benadryl and that helped the symptoms. No difficulty swallowing or shortness of breath.  Protocols used: MEDICATION QUESTION CALL-A-AH

## 2017-07-12 NOTE — Patient Instructions (Addendum)
Take Benadryl 50mg  every 4-6 hrs x 2 days. Then take Zantac AND zyrtec as prescribed x 5 days. This will get better.   ---------------------------------------------------------  As discussed, Phentermine is a helpful component in addition to a healthy diet, exercise, and behavior modification (see below for tips).  Consider the app Sworkit. Fitness trackers such as smart-watches, smart-phones or Fitbits can help as well. Consider these websites for more information The Nutrition Source (https://www.garcia-mcintosh.com/) Precision Nutrition (http://www.edwards.info/)  - Side effects of Phentermine include: increase heart rate and blood pressure, insomnia, dry mouth, constipation, and nervousness. Please take caution in performing tasks that require mental alertness, like driving.   - Please keep in mind Phentermine is a Schedule IV drugs - a regulatory classification suggesting potential for abuse. Please be mindful of where you keep this medication and who has access to it.   - Start taking: Phentermine 15 mg/day. Take this 30 min before breakfast or 1 to 2 hours after breakfast.  Come back and see me in 2-3 weeks to check in. We can increase the dose if necessary, keeping in mind it is best to take the lowest effective dose of this medication.    What are the benefits of exercise? - Exercise has many benefits. It can: -Burn calories, which helps people control their weight -Help control blood sugar levels in people with diabetes -Lower blood pressure, especially in people with high blood pressure -Lower stress and help with depression -Keep bones strong, so they don't get thin and break easily -Lower the chance of dying from heart disease   What are the main types of exercise? - There are 3 main types of exercise. They are: -Aerobic exercise - Aerobic exercise raises a person's heart rate. Examples of aerobic exercise are walking, running, or  swimming. -Resistance training - Resistance training helps make your muscles stronger. People can do this type of exercise using weights, exercise bands, or weight machines. -Stretching - Stretching exercises help your muscles and joints move more easily. It's important to have all 3 types of exercise in your exercise program. That way, your body, muscles, and joints can be as healthy as possible.  For substantial health benefits, adults are recommended to perform moderate-intensity aerobic exercise or vigorous aerobic exercise as follows: -Moderate-intensity aerobic exercise for 150 minutes every week AND muscle-strengthening activities involving all major muscle groups at least two days per week, OR -Vigorous-intensity aerobic exercise for 75 minutes every week AND muscle-strengthening activities involving all major muscle groups at least two days per week, OR -An equivalent mix of moderate- and vigorous-intensity aerobic exercise AND muscle-strengthening activities involving all major muscle groups at least two days per week  What should I do when I exercise? - Each time you exercise, you should: -Warm up - Warming up can help keep you from hurting your muscles when you exercise. To warm up, do a light aerobic exercise (such as walking slowly) or stretch for 5 to 10 minutes. -Work out - During a workout, you can walk fast, swim, run, or use an exercise machine, for example. You should also stretch all of your joints, including your neck, shoulders, back, hips, and knees. At least 2 times a week, you can add resistance training exercises to your workout. -Cool down - Cooling down helps keep you from feeling dizzy after you exercise and helps prevent muscle cramps. To cool down, you can stretch or do a light aerobic exercise for 5 minutes.  How often should I exercise? - Doctors recommend  that people exercise at least 30 minutes a day, on 5 or more days of the week. If you can't exercise for 30  minutes straight, try to exercise for 10 minutes at a time, 3 or 4 times a day.  As far as your diet, try to shop mostly along the perimeter of the grocery store. Cut down consumption of processed foods.   The following foods are the foundation of a heart-healthy diet: Vegetables such as greens (spinach, collard greens, kale), broccoli, cabbage, carrots, bell peppers; stay away from starchy vegetables like potatoes, carrots, peas Fruits such as avocados, apples, berries, bananas, oranges, pears, grapes, and prunes  Whole grains such as plain oatmeal, brown rice, and whole-grain bread or tortillas  Fat-free or low-fat dairy foods such as milk, cheese, or yogurt  Protein-rich foods:  Fish high in omega-3 fatty acids, such as salmon, tuna, and trout, about 8 ounces a week  Lean meats such as 95 percent lean ground beef or pork tenderloin  Poultry such as skinless chicken or Malaw[place]urkey  Eggs  Nuts, seeds, and soy products: quinoa, chia seeds Legumes such as kidney beans, lentils, chickpeas, black-eyed peas, and lima beans Oils and foods containing high levels of monounsaturated and polyunsaturated fats that can help lower blood cholesterol levels and the risk of cardiovascular disease. Some sources of these oils are:  Canola, corn, olive, safflower, sesame, sunflower, and soybean oils  Nuts such as walnuts, almonds, and pine nuts  Nut and seed butters  Salmon and trout  Seeds such as sesame, sunflower, pumpkin, or flax  Avocados  Tofu   Vega protein is good protein powder, make sure you use ~6 ice cubes to give it smoothie consistency together with ~4-6 ounces of vanilla soy milk. Throw cinnamon into your shake, use peanut butter. You can also use the fruits listed above. Throw spinach or kale into the shake.   Recipe ideas: [facility] website, [facility], allrecipes.com, wholefoodsmarket.com  Limit added sugars When you follow a heart-healthy eating plan, you  should limit the amount of calories you consume each day from added sugars. Because added sugars do not provide essential nutrients and are extra calories, limiting them can help you choose nutrient-rich foods and stay within your daily calorie limit. Some foods, such as fruit, contain natural sugars. Added sugars do not occur naturally in foods, but instead are used to sweeten foods and drinks. Some examples of added sugars include brown sugar, corn syrup, dextrose, fructose, glucose, high-fructose corn syrup, raw sugar, and sucrose. In the [place], sweetened drinks, snacks, and sweets are the major sources of added sugars. Sweetened drinks account for about half of all added sugars consumed. The following are examples of foods and drinks with added sugars. Sweetened drinks include soft drinks or sodas, fruit drinks, sweetened coffee and tea, energy drinks, alcoholic drinks, and favored waters.  Snacks and sweets include grain-based desserts such as cakes, pies, cookies, brownies, doughnuts; dairy desserts such as ice cream, frozen desserts, and pudding; candies; sugars; jams; syrups; and sweet toppings. To help you reduce the amount of added sugars in your diet: Choose unsweetened or whole fruits for snacks or dessert.  Choose drinks without added sugar such as water, low-fat or fat-free milk, or 100 percent fruit or vegetable juice.  Limit intake of sweetened drinks, snacks and desserts by eating them less often and in smaller amounts.  If you drink alcohol, you should limit your intake. Men should have no more than  two alcoholic drinks per day. Women should have no more than one alcoholic drink per day. One drink is: 12 ounces of regular beer (5 percent alcohol)  5 ounces of wine (12 percent alcohol)  1 ounces of 80-proof liquor (40 percent alcohol)     IF you received an x-ray today, you will receive an invoice from [facility]. Please contact [facility] at  [phone] with questions or concerns regarding your invoice.   IF you received labwork today, you will receive an invoice from [facility]. Please contact [facility] at [phone] with questions or concerns regarding your invoice.   Our billing staff will not be able to assist you with questions regarding bills from these companies.  You will be contacted with the lab results as soon as they are available. The fastest way to get your results is to activate your My Chart account. Instructions are located on the last page of this paperwork. If you have not heard from [place] regarding the results in 2 weeks, please contact this office.

## 2017-07-13 ENCOUNTER — Other Ambulatory Visit: Payer: Self-pay | Admitting: Physician Assistant

## 2017-07-14 ENCOUNTER — Other Ambulatory Visit: Payer: Self-pay | Admitting: Obstetrics and Gynecology

## 2017-07-14 DIAGNOSIS — Z1231 Encounter for screening mammogram for malignant neoplasm of breast: Secondary | ICD-10-CM

## 2017-07-17 ENCOUNTER — Other Ambulatory Visit: Payer: Self-pay | Admitting: Obstetrics and Gynecology

## 2017-07-17 DIAGNOSIS — N63 Unspecified lump in unspecified breast: Secondary | ICD-10-CM

## 2017-07-19 ENCOUNTER — Telehealth: Payer: Self-pay | Admitting: *Deleted

## 2017-07-19 NOTE — Telephone Encounter (Signed)
Pa started  Key: [id number] PHENTERMINE

## 2017-07-20 ENCOUNTER — Ambulatory Visit
Admission: RE | Admit: 2017-07-20 | Discharge: 2017-07-20 | Disposition: A | Discharge status: Home / Self Care | Payer: BC Managed Care – PPO | Source: Ambulatory Visit | Attending: Obstetrics and Gynecology | Admitting: Obstetrics and Gynecology

## 2017-07-20 DIAGNOSIS — N63 Unspecified lump in unspecified breast: Secondary | ICD-10-CM

## 2017-08-07 ENCOUNTER — Ambulatory Visit: Payer: BC Managed Care – PPO | Admitting: Physician Assistant

## 2017-08-07 ENCOUNTER — Encounter: Payer: Self-pay | Admitting: Physician Assistant

## 2017-08-07 VITALS — BP 122/79 | HR 79 | Temp 98.6°F | Resp 18 | Ht 64.0 in | Wt 205.0 lb

## 2017-08-07 DIAGNOSIS — E669 Obesity, unspecified: Secondary | ICD-10-CM | POA: Diagnosis not present

## 2017-08-07 DIAGNOSIS — E66812 Obesity, class 2: Secondary | ICD-10-CM

## 2017-08-07 DIAGNOSIS — Z6835 Body mass index (BMI) 35.0-35.9, adult: Secondary | ICD-10-CM

## 2017-08-07 NOTE — Progress Notes (Signed)
[name]  MRN: [id number] DOB: [birth year]  PCP: Sebastian Ache, PA-C  Subjective:  Pt is a pleasant 52 year old female who presents for follow-up. [facility].  She is happy with the variety of specialty services offered at the [place].  She has appointment with therapist in 2 weeks.  [name] admits to PTSD possibly affecting her chronic pain.  She is taking CBD oil for this problem, with benefit.  She has support from her husband, "he does not know what to do with me.  He tries to help." She also complains of continued pain of her left foot.  History of plantar wart.  [facility]  She has not yet started phentermine for weight loss.  She plans to start this this coming weekend.  Will start 15 mg daily.  She weighs 205 pounds today  Review of Systems  Constitutional: Negative for chills, diaphoresis, fatigue and fever.  HENT: Negative for congestion, postnasal drip, rhinorrhea, sinus pressure, sneezing and sore throat.   Respiratory: Negative for cough, chest tightness, shortness of breath and wheezing.   Cardiovascular: Negative for chest pain and palpitations.  Gastrointestinal: Negative for abdominal pain, diarrhea, nausea and vomiting.  Musculoskeletal: Positive for arthralgias, back pain and gait problem.  Neurological: Negative for weakness, light-headedness and headaches.  Psychiatric/Behavioral: Positive for dysphoric mood.    Patient Active Problem List   Diagnosis Date Noted  . Essential hypertension 05/05/2016  . Chronic arthralgias of knees and hips 05/05/2016  . Cervical disc disorder with radiculopathy of cervical region 12/01/2015  . Tobacco user 07/18/2013  . Obesity, unspecified 07/18/2013  . Seasonal allergies 07/18/2013  . S/P cervical spinal fusion 02/27/2012    Current Outpatient Medications on File Prior to Visit  Medication Sig Dispense Refill  .  cetirizine (ZYRTEC) 10 MG tablet TAKE 1 TABLET(10 MG) BY MOUTH DAILY 30 tablet 0  . phentermine 15 MG capsule Take 1 capsule (15 mg total) by mouth every morning. 30 capsule 2   Current Facility-Administered Medications on File Prior to Visit  Medication Dose Route Frequency Provider Last Rate Last Dose  . Tdap (BOOSTRIX) injection 0.5 mL  0.5 mL Intramuscular Once Luigi Stuckey, Madelaine Bhat, PA-C        Allergies  Allergen Reactions  . Azithromycin Shortness Of Breath and Swelling  . Claritin [Loratadine] Other (See Comments)    Discoloration of lips w/ swelling.  Marland Kitchen Penicillins Other (See Comments)    Has patient had a PCN reaction causing immediate rash, facial/tongue/throat swelling, SOB or lightheadedness with hypotension: unknown Has patient had a PCN reaction causing severe rash involving mucus membranes or skin necrosis: unknown Has patient had a PCN reaction that required hospitalization unknown Has patient had a PCN reaction occurring within the last 10 years: yes If all of the above answers are "NO", then may proceed with Cephalosporin use.   . Prozac [Fluoxetine Hcl]     Throat swelling     Objective:  BP 122/79   Pulse 79   Temp 98.6 F (37 C) (Oral)   Resp 18   Ht 5\' 4"  (1.626 m)   Wt 205 lb (93 kg)   SpO2 99%   BMI 35.19 kg/m   Physical Exam  Constitutional: She is oriented to person, place, and time. No distress.  Cardiovascular: Normal rate, regular rhythm and normal heart sounds.  Neurological: She is alert and oriented to  person, place, and time.  Skin: Skin is warm and dry.  Psychiatric: Judgment normal.  Vitals reviewed.   Assessment and Plan :  1. Class 2 obesity with body mass index (BMI) of 35.0 to 35.9 in adult, unspecified obesity type, unspecified whether serious comorbidity present -Patient has not yet started phentermine.  Plans to start 15 mg next week.  Will contact with how she is feeling and results.  Okay to increase dose if needed.   Discussed weight management and diet.   Marco CollieWhitney Rowen Wilmer, PA-C  [facility] at [facility] 08/07/2017 2:15 PM

## 2017-08-07 NOTE — Patient Instructions (Addendum)
Phentermine is a helpful component in addition to a healthy diet, exercise, and behavior modification (see below for tips).  Consider the app Sworkit. Fitness trackers such as smart-watches, smart-phones or Fitbits can help as well. Consider these websites for more information The Nutrition Source (https://www.garcia-mcintosh.com/https://www.hsph.[employer].edu/nutritionsource) Precision Nutrition (http://www.edwards.info/www.precisionnutrition.com/blog/infographics)  - Side effects of Phentermine include: increase heart rate and blood pressure, insomnia, dry mouth, constipation, and nervousness. Please take caution in performing tasks that require mental alertness, like driving.   - Please keep in mind Phentermine is a Schedule IV drugs - a regulatory classification suggesting potential for abuse. Please be mindful of where you keep this medication and who has access to it.   - Start taking: Phentermine 15 mg/day. Take this 30 min before breakfast or 1 to 2 hours after breakfast.  Come back and see me in 3 weeks to check in. We can increase the dose if necessary, keeping in mind it is best to take the lowest effective dose of this medication.   What are the benefits of exercise?-Exercise has many benefits. It can: -Burn calories, which helps people control their weight -Help control blood sugar levels in people with diabetes -Lower blood pressure, especially in people with high blood pressure -Lower stress and help with depression -Keep bones strong, so they don't get thin and break easily -Lower the chance of dying from heart disease   What are the main types of exercise?-There are 3 main types of exercise. They are: -Aerobic exercise - Aerobic exercise raises a person's heart rate. Examples of aerobic exercise are walking, running, or swimming. -Resistance training - Resistance training helps make your muscles stronger. People can do this type of exercise using weights, exercise bands, or weight machines. -Stretching - Stretching  exercises help your muscles and joints move more easily. It's important to have all 3 types of exercise in your exercise program. That way, your body, muscles, and joints can be as healthy as possible.  For substantial health benefits, adults are recommended to perform moderate-intensity aerobic exercise or vigorous aerobic exercise as follows: -Moderate-intensity aerobic exercise for 150 minutes every week AND muscle-strengthening activities involving all major muscle groups at least two days per week, OR -Vigorous-intensity aerobic exercise for 75 minutes every week AND muscle-strengthening activities involving all major muscle groups at least two days per week, OR -An equivalent mix of moderate- and vigorous-intensity aerobic exercise AND muscle-strengthening activities involving all major muscle groups at least two days per week  What should I do when I exercise?-Each time you exercise, you should: -Warm up - Warming up can help keep you from hurting your muscles when you exercise. To warm up, do a light aerobic exercise (such as walking slowly) or stretch for 5 to 10 minutes. -Work out - During a workout, you can walk fast, swim, run, or use an exercise machine, for example. You should also stretch all of your joints, including your neck, shoulders, back, hips, and knees. At least 2 times a week, you can add resistance training exercises to your workout. -Cool down - Cooling down helps keep you from feeling dizzy after you exercise and helps prevent muscle cramps. To cool down, you can stretch or do a light aerobic exercise for 5 minutes.  How often should I exercise?-Doctors recommend that people exercise at least 30 minutes a day, on 5 or more days of the week. If you can't exercise for 30 minutes straight, try to exercise for 10 minutes at a time, 3 or 4  times a day.  As far as your diet, try to shop mostly along the perimeter of the grocery store. Cut down consumption of processed  foods.   The following foods are the foundation of a heart-healthy diet: Vegetables such as greens (spinach, collard greens, kale), broccoli, cabbage, carrots, bell peppers; stay away from starchy vegetables like potatoes, carrots, peas Fruits such as avocados, apples, berries, bananas, oranges, pears, grapes, and prunes  Whole grains such as plain oatmeal, brown rice, and whole-grain bread or tortillas  Fat-free or low-fat dairy foods such as milk, cheese, or yogurt  Protein-rich foods:  Fish high in omega-3 fatty acids, such as salmon, tuna, and trout, about 8 ounces a week  Lean meats such as 95 percent lean ground beef or pork tenderloin  Poultry such as skinless chicken or [place]  Eggs  Nuts, seeds, and soy products: quinoa, chia seeds Legumes such as kidney beans, lentils, chickpeas, black-eyed peas, and lima beans Oils and foods containing high levels of monounsaturated and polyunsaturated fats that can help lower blood cholesterol levels and the risk of cardiovascular disease. Some sources of these oils are:  Canola, corn, olive, safflower, sesame, sunflower, and soybean oils  Nuts such as walnuts, almonds, and pine nuts  Nut and seed butters  Salmon and trout  Seeds such as sesame, sunflower, pumpkin, or flax  Avocados  Tofu  Vega protein is good protein powder, make sure you use ~6 ice cubes to give it smoothie consistency together with ~4-6 ounces of vanilla soy milk. Throw cinnamon into your shake, use peanut butter. You can also use the fruits listed above. Throw spinach or kale into the shake.   Recipe ideas: [employer], [employer], [facility], [facility], allrecipes.com, wholefoodsmarket.com  Limit added sugars When you follow a heart-healthy eating plan, you should limit the amount of calories you consume each day from added sugars. Because added sugars do not provide essential nutrients and are extra calories, limiting them can help you choose  nutrient-rich foods and stay within your daily calorie limit. Some foods, such as fruit, contain natural sugars. Added sugars do not occur naturally in foods, but instead are used to sweeten foods and drinks. Some examples of added sugars include brown sugar, corn syrup, dextrose, fructose, glucose, high-fructose corn syrup, raw sugar, and sucrose. In the [place], sweetened drinks, snacks, and sweets are the major sources of added sugars. Sweetened drinks account for about half of all added sugars consumed. The following are examples of foods and drinks with added sugars. Sweetened drinks include soft drinks or sodas, fruit drinks, sweetened coffee and tea, energy drinks, alcoholic drinks, and favored waters.  Snacks and sweets include grain-based desserts such as cakes, pies, cookies, brownies, doughnuts; dairy desserts such as ice cream, frozen desserts, and pudding; candies; sugars; jams; syrups; and sweet toppings. To help you reduce the amount of added sugars in your diet: Choose unsweetened or whole fruits for snacks or dessert.  Choose drinks without added sugar such as water, low-fat or fat-free milk, or 100 percent fruit or vegetable juice.  Limit intake of sweetened drinks, snacks and desserts by eating them less often and in smaller amounts.  If you drink alcohol, you should limit your intake. Men should have no more than two alcoholic drinks per day. Women should have no more than one alcoholic drink per day. One drink is: 12 ounces of regular beer (5 percent alcohol)  5 ounces of wine (12 percent alcohol)  1 ounces  of 80-proof liquor (40 percent alcohol)

## 2017-09-18 ENCOUNTER — Encounter: Payer: Self-pay | Admitting: Physician Assistant

## 2017-09-19 ENCOUNTER — Other Ambulatory Visit: Payer: Self-pay | Admitting: Physician Assistant

## 2017-10-17 ENCOUNTER — Other Ambulatory Visit: Payer: Self-pay | Admitting: Physician Assistant

## 2017-11-02 ENCOUNTER — Encounter: Payer: Self-pay | Admitting: Physician Assistant

## 2017-11-02 ENCOUNTER — Other Ambulatory Visit: Payer: Self-pay

## 2017-11-02 ENCOUNTER — Ambulatory Visit: Payer: BC Managed Care – PPO | Admitting: Physician Assistant

## 2017-11-02 VITALS — BP 124/84 | HR 82 | Temp 98.0°F | Resp 16 | Ht 65.0 in | Wt 202.4 lb

## 2017-11-02 DIAGNOSIS — M549 Dorsalgia, unspecified: Secondary | ICD-10-CM | POA: Diagnosis not present

## 2017-11-02 DIAGNOSIS — M25551 Pain in right hip: Secondary | ICD-10-CM | POA: Diagnosis not present

## 2017-11-02 DIAGNOSIS — M5010 Cervical disc disorder with radiculopathy, unspecified cervical region: Secondary | ICD-10-CM

## 2017-11-02 DIAGNOSIS — G8929 Other chronic pain: Secondary | ICD-10-CM

## 2017-11-02 DIAGNOSIS — M25562 Pain in left knee: Secondary | ICD-10-CM

## 2017-11-02 DIAGNOSIS — M25552 Pain in left hip: Secondary | ICD-10-CM

## 2017-11-02 DIAGNOSIS — Z0271 Encounter for disability determination: Secondary | ICD-10-CM

## 2017-11-02 DIAGNOSIS — M25561 Pain in right knee: Secondary | ICD-10-CM

## 2017-11-02 NOTE — Patient Instructions (Signed)
° ° ° °  If you have lab work done today you will be contacted with your lab results within the next 2 weeks.  If you have not heard from us then please contact us. The fastest way to get your results is to register for My Chart. ° ° °IF you received an x-ray today, you will receive an invoice from [facility]. Please contact [facility] at [phone] with questions or concerns regarding your invoice.  ° °IF you received labwork today, you will receive an invoice from [facility]. Please contact [facility] at [phone] with questions or concerns regarding your invoice.  ° °Our billing staff will not be able to assist you with questions regarding bills from these companies. ° °You will be contacted with the lab results as soon as they are available. The fastest way to get your results is to activate your My Chart account. Instructions are located on the last page of this paperwork. If you have not heard from us regarding the results in 2 weeks, please contact this office. °  ° ° ° °

## 2017-11-02 NOTE — Progress Notes (Signed)
CHARNISE LOVAN  MRN: [id number] DOB: 10-29-1965  PCP: Sebastian Ache, PA-C  Subjective:  Pt is a 52 year old female who presents to clinic for f/u back pain.  Pain is worsened by PTSD as well as non-resolving plantar wart. Pain from plantar wart is causing her to compensate and causing her pain of ankle, knees, hips and back. She is seeing podiatrist at the [place]. She is considering surgery.   Recent imaging of her neck shows carotid artery stenosis and she was referred to [facility] for surgery. She is nervous about the procedure.   currently out of work as of last Friday.  She is currently receiving care through the [place] - She has a Child psychotherapist, mental health, podiatrist, GYN.   Needs to update fmla  Review of Systems  Cardiovascular: Negative for chest pain, palpitations and leg swelling.  Musculoskeletal: Positive for arthralgias, back pain and gait problem.    Patient Active Problem List   Diagnosis Date Noted  . Essential hypertension 05/05/2016  . Chronic arthralgias of knees and hips 05/05/2016  . Cervical disc disorder with radiculopathy of cervical region 12/01/2015  . Tobacco user 07/18/2013  . Obesity, unspecified 07/18/2013  . Seasonal allergies 07/18/2013  . S/P cervical spinal fusion 02/27/2012    Current Outpatient Medications on File Prior to Visit  Medication Sig Dispense Refill  . cetirizine (ZYRTEC) 10 MG tablet TAKE 1 TABLET(10 MG) BY MOUTH DAILY 30 tablet 0  . phentermine 15 MG capsule Take 1 capsule (15 mg total) by mouth every morning. 30 capsule 2   Current Facility-Administered Medications on File Prior to Visit  Medication Dose Route Frequency Provider Last Rate Last Dose  . Tdap (BOOSTRIX) injection 0.5 mL  0.5 mL Intramuscular Once Dondi Burandt, Madelaine Bhat, PA-C        Allergies  Allergen Reactions  . Azithromycin Shortness Of Breath and Swelling  . Claritin [Loratadine] Other (See Comments)    Discoloration of lips w/ swelling.   Marland Kitchen Penicillins Other (See Comments)    Has patient had a PCN reaction causing immediate rash, facial/tongue/throat swelling, SOB or lightheadedness with hypotension: unknown Has patient had a PCN reaction causing severe rash involving mucus membranes or skin necrosis: unknown Has patient had a PCN reaction that required hospitalization unknown Has patient had a PCN reaction occurring within the last 10 years: yes If all of the above answers are "NO", then may proceed with Cephalosporin use.   . Prozac [Fluoxetine Hcl]     Throat swelling     Objective:  BP 124/84 (BP Location: Left Arm, Patient Position: Sitting, Cuff Size: Normal)   Pulse 82   Temp 98 F (36.7 C)   Resp 16   Ht 5\' 5"  (1.651 m)   Wt 202 lb 6.4 oz (91.8 kg)   LMP 09/11/2017 Comment: perimenopausal  SpO2 100%   BMI 33.68 kg/m   Physical Exam  Constitutional: She is oriented to person, place, and time. No distress.  Cardiovascular: Normal rate, regular rhythm and normal heart sounds.  Neurological: She is alert and oriented to person, place, and time.  Skin: Skin is warm and dry.  Psychiatric: Judgment normal.  Vitals reviewed.   Assessment and Plan :  1. Cervical disc disorder with radiculopathy of cervical region 2. Chronic back pain, unspecified back location, unspecified back pain laterality 3. Chronic arthralgias of knees and hips - Pt presents for f/u chronic pain. Needs FMLA forms filled out. She plans to undergo surgery at  [facility] for carotid artery stenosis. Plans to receive care from the [facility] for PTSD, mental health.   Marco Collie, PA-C  Primary Care at [facility] 11/02/2017 11:52 AM  Please note: Portions of this report may have been transcribed using dragon voice recognition software. Every effort was made to ensure accuracy; however, inadvertent computerized transcription errors may be present.

## 2017-11-06 ENCOUNTER — Telehealth: Payer: Self-pay | Admitting: Physician Assistant

## 2017-11-06 NOTE — Telephone Encounter (Signed)
Patient needs FMLA and Short Term disability forms completed by [place]. I was not sure what the patient was being taken out of work for so I could not complete the forms. I will place the blank forms in [name] box on 11/06/17 please return them to the FMLA/Disability desk at the [employer] checkout desk within 5-7 business days. Thank you

## 2017-11-13 ENCOUNTER — Encounter: Payer: Self-pay | Admitting: Physician Assistant

## 2017-11-13 DIAGNOSIS — F329 Major depressive disorder, single episode, unspecified: Secondary | ICD-10-CM | POA: Insufficient documentation

## 2017-11-13 DIAGNOSIS — F32A Depression, unspecified: Secondary | ICD-10-CM | POA: Insufficient documentation

## 2017-11-13 DIAGNOSIS — G8929 Other chronic pain: Secondary | ICD-10-CM | POA: Insufficient documentation

## 2017-11-13 DIAGNOSIS — M47819 Spondylosis without myelopathy or radiculopathy, site unspecified: Secondary | ICD-10-CM | POA: Insufficient documentation

## 2017-11-13 DIAGNOSIS — M549 Dorsalgia, unspecified: Secondary | ICD-10-CM

## 2017-11-13 NOTE — Telephone Encounter (Signed)
FMLA forms completes and will be dropped off in FMLA box today

## 2017-11-14 NOTE — Telephone Encounter (Signed)
Paperwork scanned and released to patients MyChart on 11/14/17 at 11:10am

## 2017-11-22 ENCOUNTER — Other Ambulatory Visit: Payer: Self-pay | Admitting: Physician Assistant

## 2017-11-23 ENCOUNTER — Encounter: Payer: Self-pay | Admitting: Physician Assistant

## 2017-11-23 NOTE — Telephone Encounter (Signed)
Patient is calling and states her employer did not receive this paperwork. She would like it faxed to the number provided below with a confirmation call at [phone]. She would also like a copy of this picked up by her mother [name] in office. Please contact when ready for pickup.   Fax# [phone]  Attention.  Aleene Davidson

## 2017-11-23 NOTE — Telephone Encounter (Signed)
Copied from CRM [id number]. Topic: Quick Communication - See Telephone Encounter >> Nov 23, 2017  3:01 PM Windy Kalata, NT wrote: CRM for notification. See Telephone encounter for: 11/23/17.  Patient is calling and states her employer did not receive this paperwork. She would like it faxed to the number provided below with a confirmation call at [phone]. She would also like a copy of this picked up by her mother Priscille Heidelberg in office. Please contact when ready for pickup.   Fax# [phone]  Attention.  Aleene Davidson

## 2017-11-27 ENCOUNTER — Encounter: Payer: Self-pay | Admitting: Physician Assistant

## 2017-12-28 ENCOUNTER — Encounter: Payer: Self-pay | Admitting: Physician Assistant

## 2018-03-19 DIAGNOSIS — Z0271 Encounter for disability determination: Secondary | ICD-10-CM

## 2018-05-04 IMAGING — MR MR ANKLE*L* W/O CM
4 of 5 series · 27 of 40 positions shown · non-contrast
Comparison: Radiographs 02/08/2017

CLINICAL DATA: Intermittent lateral ankle pain for 8 months.
Palpable lump posteriorly. No acute injury. Question peroneal tendon
tear.

EXAM:
MRI OF THE LEFT ANKLE WITHOUT CONTRAST
TECHNIQUE: Multiplanar, multisequence MR imaging of the ankle was performed. No
intravenous contrast was administered.

[Series 5: T2 fat-sat · coronal · 4.0mm · 0.33mm/px · 9 of 24 slices shown (1 of 3)]
[im 1/24]
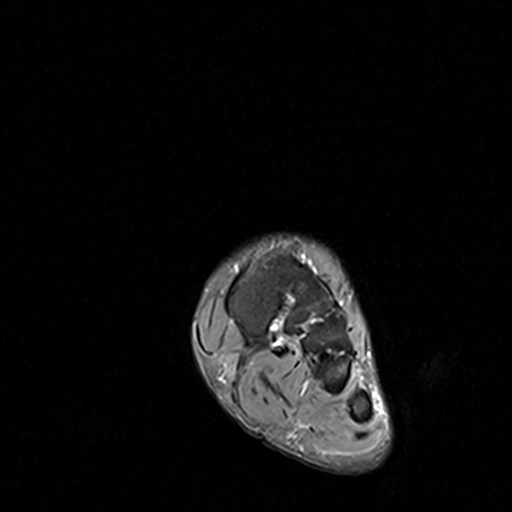
[im 3/24]
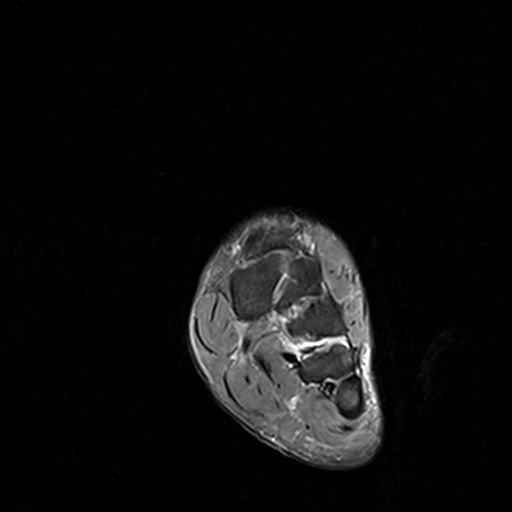
[im 6/24]
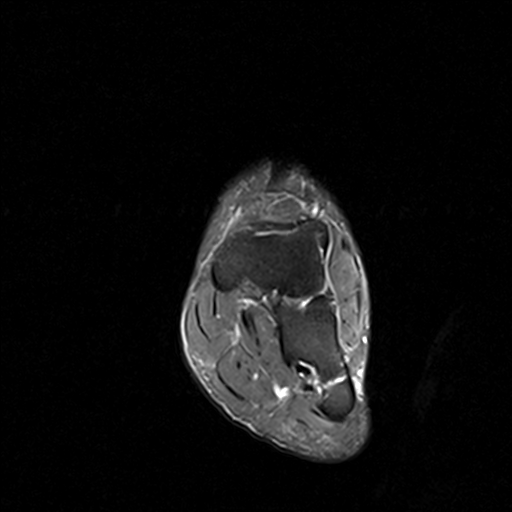
[im 9/24]
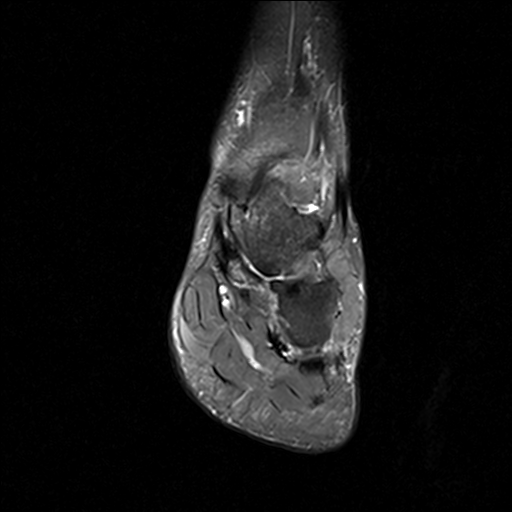
[im 12/24]
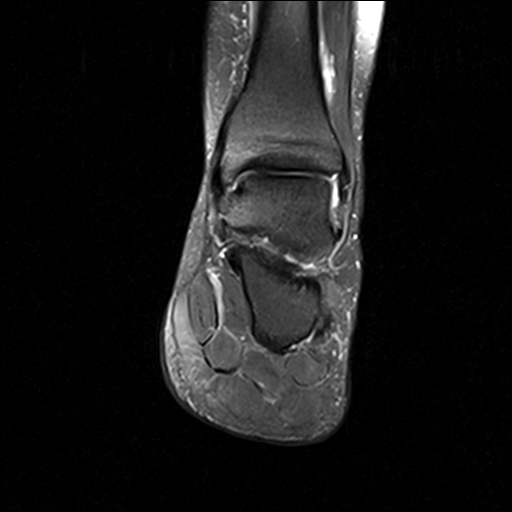
[im 15/24]
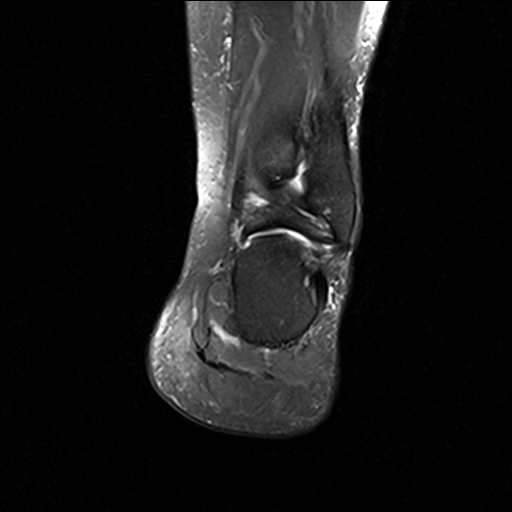
[im 18/24]
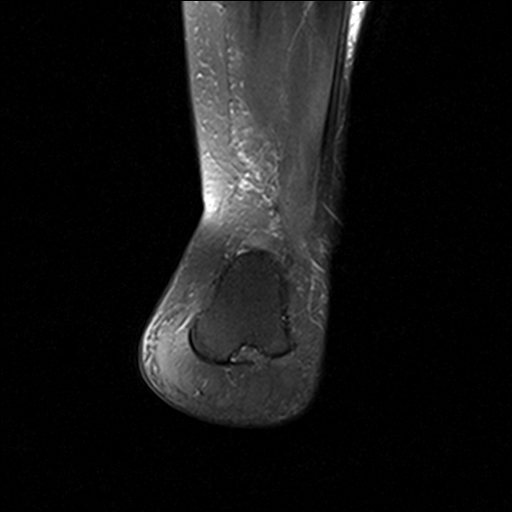
[im 21/24]
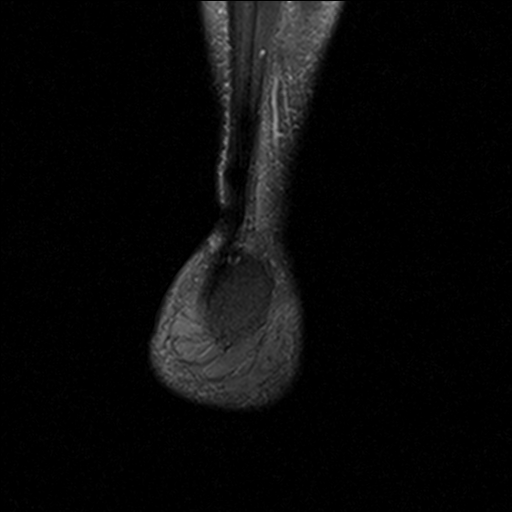
[im 24/24]
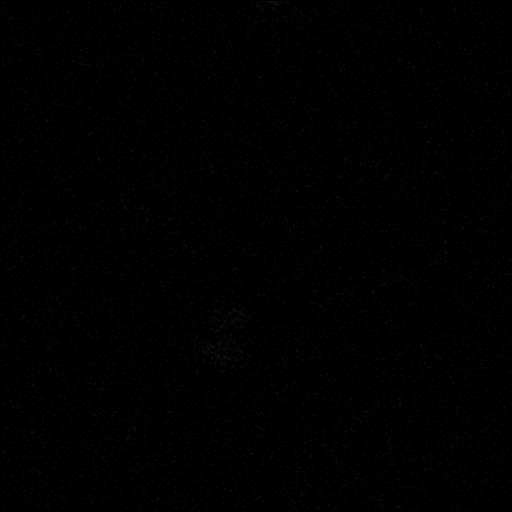

[Series 6: T2 fat-sat · sagittal · 3.0mm · 0.33mm/px · 6 of 19 slices shown (2 of 3)]
[im 1/19]
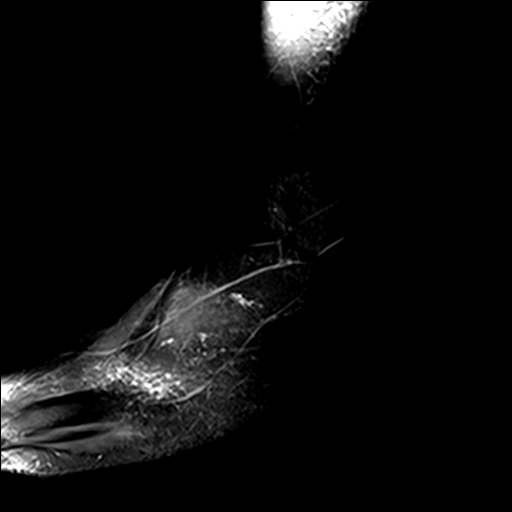
[im 4/19]
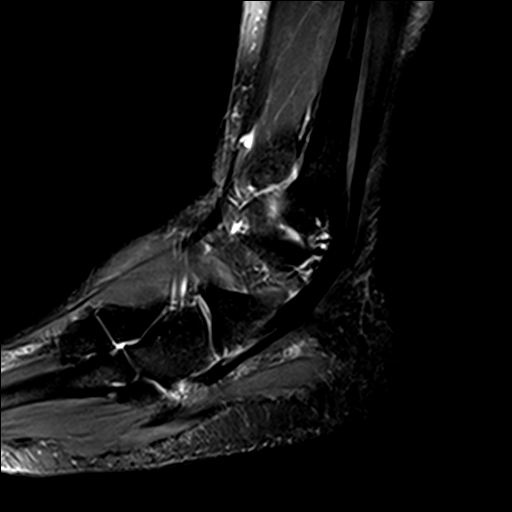
[im 7/19]
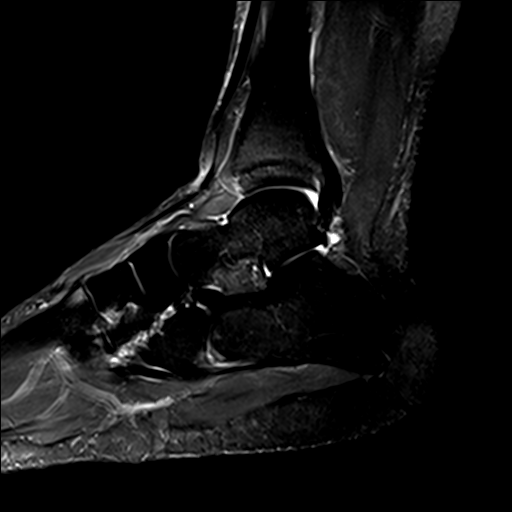
[im 10/19]
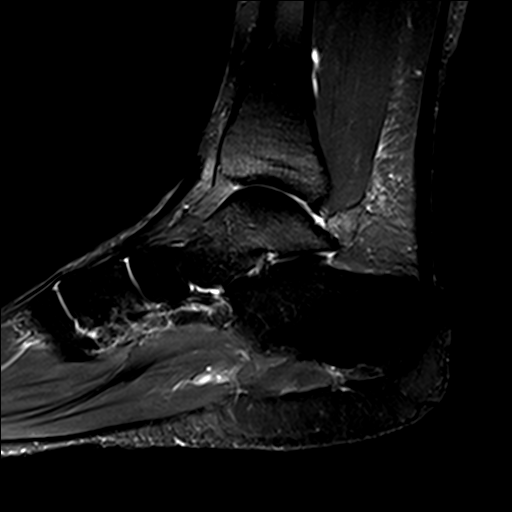
[im 13/19]
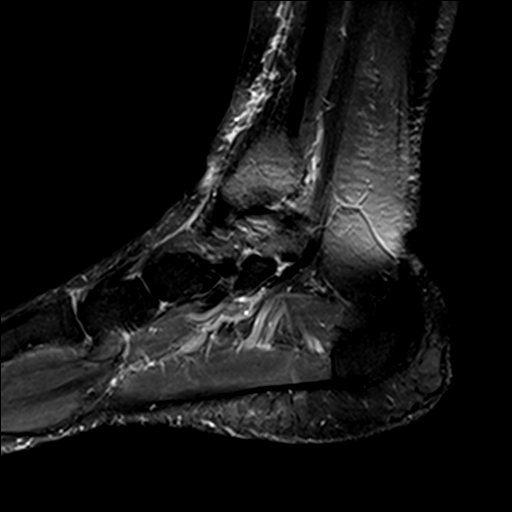
[im 16/19]
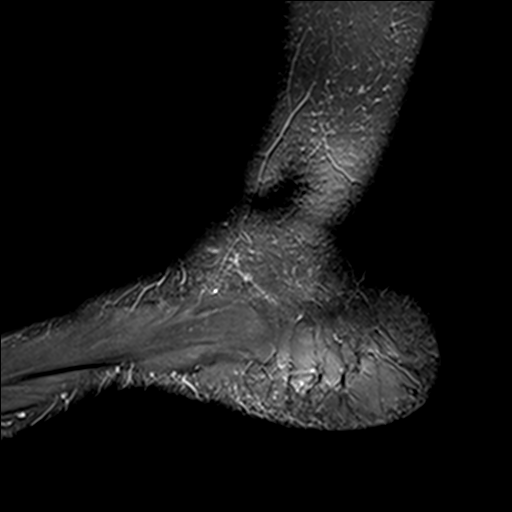

[Series 7: T2 fat-sat · axial · 4.0mm · 0.53mm/px · z∈[-78,+8]mm · 3 of 24 slices shown (3 of 3)]
[im 3/24]
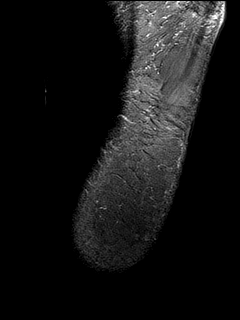
[im 12/24]
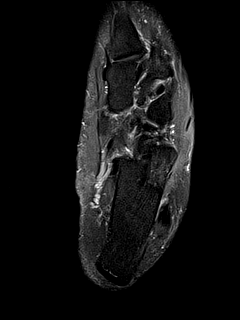
[im 21/24]
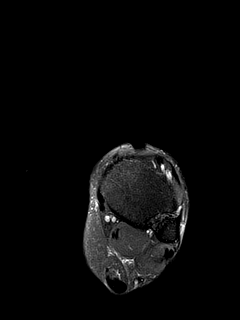

[Series 8: PD fat-sat · axial · 3.5mm · 0.53mm/px · z∈[-81,+16]mm · 9 of 24 slices shown]
[im 1/24]
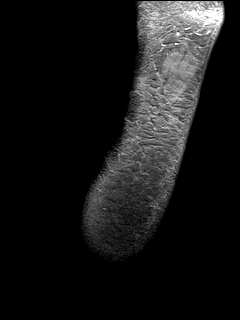
[im 3/24]
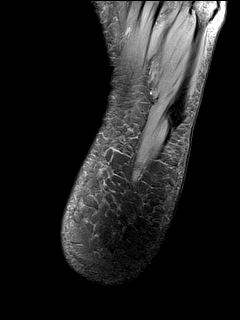
[im 6/24]
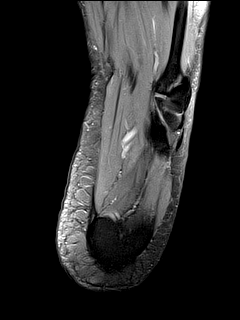
[im 9/24]
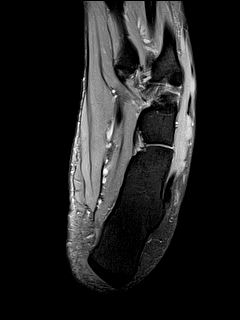
[im 12/24]
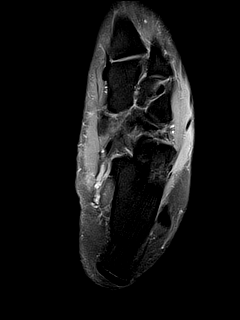
[im 15/24]
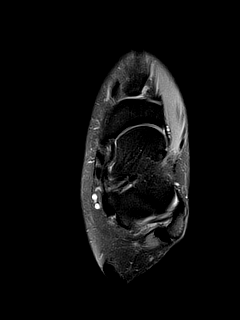
[im 18/24]
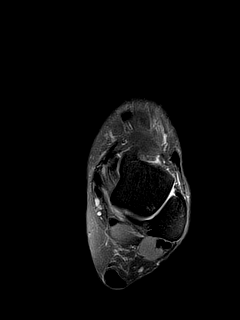
[im 21/24]
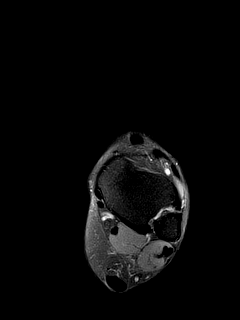
[im 24/24]
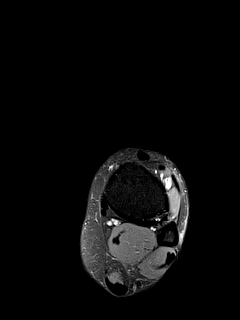

[27 of 40 positions shown; findings below may reference images not displayed]

FINDINGS: TENDONS

Peroneal: Intact and normally positioned.

Posteromedial: Intact and normally positioned.

Anterior: Intact and normally positioned.

Achilles: Intact. Perhaps minimal fusiform enlargement of the tendon
proximal to the insertion. No abnormal signal or surrounding
inflammation.

Plantar Fascia: Intact.

LIGAMENTS

Lateral: The anterior and posterior talofibular and calcaneofibular
ligaments are intact.

Medial: The deltoid and visualized portions of the spring ligament
appear intact.

CARTILAGE AND BONES

Ankle Joint: No significant ankle joint effusion. The talar dome and
tibial plafond are intact.

Subtalar Joints/Sinus Tarsi: Unremarkable.

Bones: Minimal midfoot degenerative changes. Elongated anterior
process of the calcaneus without signs of tarsal coalition. No acute
osseous findings.

Other: No marker was placed for the palpable concern. No
periarticular soft tissue mass, fluid collection or inflammatory
change demonstrated.
IMPRESSION: 1. No acute findings or clear explanation for the patient's
symptoms. The peroneal tendons appear normal.
2. Possible minimal non insertional Achilles tendinosis.
3. Minimal midfoot degenerative changes.

## 2018-08-16 LAB — HM MAMMOGRAPHY

## 2018-08-24 ENCOUNTER — Other Ambulatory Visit: Payer: Self-pay | Admitting: Physician Assistant

## 2019-01-01 ENCOUNTER — Encounter (HOSPITAL_COMMUNITY): Payer: Self-pay | Admitting: Emergency Medicine

## 2019-01-01 ENCOUNTER — Emergency Department (HOSPITAL_BASED_OUTPATIENT_CLINIC_OR_DEPARTMENT_OTHER): Payer: BC Managed Care – PPO

## 2019-01-01 ENCOUNTER — Other Ambulatory Visit: Payer: Self-pay

## 2019-01-01 ENCOUNTER — Emergency Department (HOSPITAL_COMMUNITY)
Admission: EM | Admit: 2019-01-01 | Discharge: 2019-01-01 | Disposition: A | Discharge status: Home / Self Care | Payer: BC Managed Care – PPO | Attending: Emergency Medicine | Admitting: Emergency Medicine

## 2019-01-01 ENCOUNTER — Encounter: Payer: Self-pay | Admitting: Registered Nurse

## 2019-01-01 ENCOUNTER — Ambulatory Visit: Payer: BC Managed Care – PPO | Admitting: Registered Nurse

## 2019-01-01 VITALS — BP 140/95 | HR 72 | Temp 98.6°F | Resp 12 | Ht 64.0 in | Wt 200.0 lb

## 2019-01-01 DIAGNOSIS — Z79899 Other long term (current) drug therapy: Secondary | ICD-10-CM | POA: Insufficient documentation

## 2019-01-01 DIAGNOSIS — Z7901 Long term (current) use of anticoagulants: Secondary | ICD-10-CM | POA: Insufficient documentation

## 2019-01-01 DIAGNOSIS — M7981 Nontraumatic hematoma of soft tissue: Secondary | ICD-10-CM | POA: Diagnosis not present

## 2019-01-01 DIAGNOSIS — F17210 Nicotine dependence, cigarettes, uncomplicated: Secondary | ICD-10-CM | POA: Diagnosis not present

## 2019-01-01 DIAGNOSIS — Y929 Unspecified place or not applicable: Secondary | ICD-10-CM | POA: Insufficient documentation

## 2019-01-01 DIAGNOSIS — M79609 Pain in unspecified limb: Secondary | ICD-10-CM | POA: Diagnosis not present

## 2019-01-01 DIAGNOSIS — Z7982 Long term (current) use of aspirin: Secondary | ICD-10-CM | POA: Insufficient documentation

## 2019-01-01 DIAGNOSIS — M79604 Pain in right leg: Secondary | ICD-10-CM | POA: Diagnosis not present

## 2019-01-01 DIAGNOSIS — Y999 Unspecified external cause status: Secondary | ICD-10-CM | POA: Diagnosis not present

## 2019-01-01 DIAGNOSIS — S8011XA Contusion of right lower leg, initial encounter: Secondary | ICD-10-CM | POA: Insufficient documentation

## 2019-01-01 DIAGNOSIS — Y939 Activity, unspecified: Secondary | ICD-10-CM | POA: Diagnosis not present

## 2019-01-01 DIAGNOSIS — Y33XXXA Other specified events, undetermined intent, initial encounter: Secondary | ICD-10-CM | POA: Diagnosis not present

## 2019-01-01 DIAGNOSIS — I10 Essential (primary) hypertension: Secondary | ICD-10-CM | POA: Insufficient documentation

## 2019-01-01 NOTE — ED Provider Notes (Signed)
[facility] EMERGENCY DEPARTMENT Provider Note   CSN: [id number] Arrival date & time: 01/01/19  1620     History   Chief Complaint No chief complaint on file.   HPI [name] is a 53 y.o. female.     The history is provided by the patient and medical records. No language interpreter was used.     53 year old female on long-term anticoagulant, Plavix presenting complaining of right calf pain.  Patient report approximate 4 days ago she noticed a bruise to the back of her right calf.  She does not recall how that bruise came about but she endorsed some mild throbbing pain to the affected area and felt some knot in the affected location.  Pain is nonradiating, no associated chest pain shortness of breath fever chills.  She states she is accustomed to developing bruising in the past, but she just voiced concern for potential blood clot.  She was seen at her PCP office who recommend patient to come to ER for further evaluation.  No other complaint  Past Medical History:  Diagnosis Date  . Allergy   . Arthritis   . Hypertension   . Osteopenia     Patient Active Problem List   Diagnosis Date Noted  . Facet arthropathy of spine 11/13/2017  . Back pain, chronic 11/13/2017  . Depression 11/13/2017  . Essential hypertension 05/05/2016  . Chronic arthralgias of knees and hips 05/05/2016  . Cervical disc disorder with radiculopathy of cervical region 12/01/2015  . Tobacco user 07/18/2013  . Obesity, unspecified 07/18/2013  . Seasonal allergies 07/18/2013  . S/P cervical spinal fusion 02/27/2012    Past Surgical History:  Procedure Laterality Date  . SPINAL CORD DECOMPRESSION N/A 09/16/2010   Cervical spine- Dr. Lynann Bologna  . SPINAL FUSION N/A 09/16/2010   Cervical spine- Dr. Lynann Bologna     OB History   No obstetric history on file.      Home Medications    Prior to Admission medications   Medication Sig Start Date End Date Taking? Authorizing  Provider  aspirin EC 325 MG tablet Take by mouth. 10/30/17   [provider]  cetirizine (ZYRTEC) 10 MG tablet TAKE 1 TABLET(10 MG) BY MOUTH DAILY 10/18/17   [provider]  clopidogrel (PLAVIX) 75 MG tablet TAKE 1 TABLET(75 MG) BY MOUTH DAILY 01/04/18   [provider]  escitalopram (LEXAPRO) 10 MG tablet Take 10 mg by mouth daily.    [provider]  gabapentin (NEURONTIN) 300 MG capsule  12/08/18   [provider]  lidocaine (XYLOCAINE) 5 % ointment  09/29/17   [provider]  methocarbamol (ROBAXIN) 750 MG tablet Take 750 mg by mouth 4 (four) times daily.    [provider]  Omega-3 Fatty Acids (FISH OIL) 1000 MG CAPS Take by mouth.    [provider]  omeprazole (PRILOSEC) 20 MG capsule Take by mouth.    [provider]  phentermine 15 MG capsule Take 1 capsule (15 mg total) by mouth every morning. 07/12/17   McVey, Gelene Mink, PA-C  vitamin E 100 UNIT capsule Take by mouth.    [provider]    Family History Family History  Problem Relation Age of Onset  . Cancer Brother   . Breast cancer Maternal Aunt   . Breast cancer Maternal Grandmother   . Breast cancer Paternal Aunt     Social History Social History   Tobacco Use  . Smoking status: Current Some Day  Smoker    Packs/day: 0.25    Years: 33.00    Pack years: 8.25    Types: Cigarettes  . Smokeless tobacco: Never Used  Substance Use Topics  . Alcohol use: Yes    Comment: occasional wine on weekends  . Drug use: No     Allergies   Azithromycin, Claritin [loratadine], Penicillins, and Prozac [fluoxetine hcl]   Review of Systems Review of Systems  All other systems reviewed and are negative.    Physical Exam Updated Vital Signs BP (!) 160/94 (BP Location: Left Arm)   Pulse 69   Temp 98.6 F (37 C) (Oral)   Resp 18   LMP 09/11/2017 Comment: perimenopausal  SpO2 96%   Physical Exam Vitals signs and nursing note  reviewed.  Constitutional:      General: She is not in acute distress.    Appearance: She is well-developed.  HENT:     Head: Atraumatic.  Eyes:     Conjunctiva/sclera: Conjunctivae normal.  Neck:     Musculoskeletal: Neck supple.  Cardiovascular:     Rate and Rhythm: Normal rate and regular rhythm.  Pulmonary:     Effort: Pulmonary effort is normal.     Breath sounds: Normal breath sounds.  Musculoskeletal:        General: Tenderness (Right lower extremity: There is an ecchymosis to the posterior calf with tenderness to palpation but no surrounding erythema or edema.  No deformity noted.  Area is mildly tender to palpation.  Dorsalis pedis pulse palpable.  Calf compartment is soft.) present.  Skin:    Findings: No rash.  Neurological:     Mental Status: She is alert.      ED Treatments / Results  Labs (all labs ordered are listed, but only abnormal results are displayed) Labs Reviewed - No data to display  EKG None  Radiology Vas [place] Lower Extremity Venous (dvt) (only Mc & Wl 7a-7p)  Result Date: 01/01/2019  Lower Venous Study Indications: Pain, and bruising.  Comparison Study: no prior Performing Technologist: Jeb Levering RDMS, RVT  Examination Guidelines: A complete evaluation includes B-mode imaging, spectral Doppler, color Doppler, and power Doppler as needed of all accessible portions of each vessel. Bilateral testing is considered an integral part of a complete examination. Limited examinations for reoccurring indications may be performed as noted.  +---------+---------------+---------+-----------+----------+--------------+ RIGHT    CompressibilityPhasicitySpontaneityPropertiesThrombus Aging +---------+---------------+---------+-----------+----------+--------------+ CFV      Full           Yes      Yes                                 +---------+---------------+---------+-----------+----------+--------------+ SFJ      Full                                                         +---------+---------------+---------+-----------+----------+--------------+ FV Prox  Full                                                        +---------+---------------+---------+-----------+----------+--------------+ FV Mid   Full                                                        +---------+---------------+---------+-----------+----------+--------------+  FV DistalFull                                                        +---------+---------------+---------+-----------+----------+--------------+ PFV      Full                                                        +---------+---------------+---------+-----------+----------+--------------+ POP      Full           Yes      Yes                                 +---------+---------------+---------+-----------+----------+--------------+ PTV      Full                                                        +---------+---------------+---------+-----------+----------+--------------+ PERO     Full                                                        +---------+---------------+---------+-----------+----------+--------------+     Summary: Right: There is no evidence of deep vein thrombosis in the lower extremity. No cystic structure found in the popliteal fossa.  *See table(s) above for measurements and observations.    Preliminary     Procedures Procedures (including critical care time)  Medications Ordered in ED Medications - No data to display   Initial Impression / Assessment and Plan / ED Course  I have reviewed the triage vital signs and the nursing notes.  Pertinent labs & imaging results that were available during my care of the patient were reviewed by me and considered in my medical decision making (see chart for details).        BP (!) 160/94 (BP Location: Left Arm)   Pulse 69   Temp 98.6 F (37 C) (Oral)   Resp 18   LMP 09/11/2017 Comment: perimenopausal  SpO2  96%    Final Clinical Impressions(s) / ED Diagnoses   Final diagnoses:  Hematoma of right lower leg    ED Discharge Orders    None     6:32 PM Patient who is currently on Plavix presenting with an atraumatic ecchymosis to her right calf, with concerns for DVT.  Venous Doppler study of her right lower extremity without any evidence of DVT.  Patient was reassured.  She does not complain of any chest pain or shortness of breath and no other concerns.  She is stable for discharge.  Appropriate care instruction provided.  Return precaution discussed.   Fayrene Helperran, Jahniya Duzan, PA-C 01/01/19 [id number]    Lockwood, Robert, MD 01/04/19 [phone]

## 2019-01-01 NOTE — Discharge Instructions (Signed)
You have been diagnosed with having a contusion to your right calf.  No evidence of blood clot to the deep vein on today's exam.  Follow instruction below.

## 2019-01-01 NOTE — ED Triage Notes (Signed)
Pt complains of bruise on right calf. Pt is on plavix and asa. Pt was told to come here for ultrasound to rule out DVT. Site is sore to the touch. Site does not appear swollen or red.

## 2019-01-01 NOTE — Progress Notes (Signed)
Acute Office Visit  Subjective:    Patient ID: [name], female    DOB: 1965-08-17, 53 y.o.   MRN: [id number]  Chief Complaint  Patient presents with  . calf pain    Pt stated---tender to touch, bruce on the right medial side of the calf.--3 days    HPI Patient is in today for bruising on R lower leg  On long term plavix, usually seen at [facility] [place]  Onset 2-3 days ago. Does not remember striking the area. RLE is warm, swollen, and mildly erythematous No numbness, weakness, tingling in R foot. No shob, headache, acute visual changes, chest pain, palpitations  Past Medical History:  Diagnosis Date  . Allergy   . Arthritis   . Hypertension   . Osteopenia     Past Surgical History:  Procedure Laterality Date  . SPINAL CORD DECOMPRESSION N/A 09/16/2010   Cervical spine- Dr. Yevette Edwardsumonski  . SPINAL FUSION N/A 09/16/2010   Cervical spine- Dr. Yevette Edwardsumonski    Family History  Problem Relation Age of Onset  . Cancer Brother   . Breast cancer Maternal Aunt   . Breast cancer Maternal Grandmother   . Breast cancer Paternal Aunt     Social History   Socioeconomic History  . Marital status: Married    Spouse name: Not on file  . Number of children: Not on file  . Years of education: Not on file  . Highest education level: Not on file  Occupational History  . Not on file  Social Needs  . Financial resource strain: Not on file  . Food insecurity    Worry: Not on file    Inability: Not on file  . Transportation needs    Medical: Not on file    Non-medical: Not on file  Tobacco Use  . Smoking status: Current Some Day Smoker    Packs/day: 0.25    Years: 33.00    Pack years: 8.25    Types: Cigarettes  . Smokeless tobacco: Never Used  Substance and Sexual Activity  . Alcohol use: Yes    Comment: occasional wine on weekends  . Drug use: No  . Sexual activity: Yes  Lifestyle  . Physical activity    Days per week: Not on file    Minutes per session: Not on  file  . Stress: Not on file  Relationships  . Social Musicianconnections    Talks on phone: Not on file    Gets together: Not on file    Attends religious service: Not on file    Active member of club or organization: Not on file    Attends meetings of clubs or organizations: Not on file    Relationship status: Not on file  . Intimate partner violence    Fear of current or ex partner: Not on file    Emotionally abused: Not on file    Physically abused: Not on file    Forced sexual activity: Not on file  Other Topics Concern  . Not on file  Social History Narrative  . Not on file    Outpatient Medications Prior to Visit  Medication Sig Dispense Refill  . aspirin EC 325 MG tablet Take by mouth.    . cetirizine (ZYRTEC) 10 MG tablet TAKE 1 TABLET(10 MG) BY MOUTH DAILY    . clopidogrel (PLAVIX) 75 MG tablet TAKE 1 TABLET(75 MG) BY MOUTH DAILY    . escitalopram (LEXAPRO) 10 MG tablet Take 10 mg by mouth daily.    .Marland Kitchen  gabapentin (NEURONTIN) 300 MG capsule     . lidocaine (XYLOCAINE) 5 % ointment     . methocarbamol (ROBAXIN) 750 MG tablet Take 750 mg by mouth 4 (four) times daily.    . Omega-3 Fatty Acids (FISH OIL) 1000 MG CAPS Take by mouth.    Marland Kitchen omeprazole (PRILOSEC) 20 MG capsule Take by mouth.    . phentermine 15 MG capsule Take 1 capsule (15 mg total) by mouth every morning. 30 capsule 2  . vitamin E 100 UNIT capsule Take by mouth.    . cetirizine (ZYRTEC) 10 MG tablet TAKE 1 TABLET(10 MG) BY MOUTH DAILY 90 tablet 2   Facility-Administered Medications Prior to Visit  Medication Dose Route Frequency Provider Last Rate Last Dose  . Tdap (BOOSTRIX) injection 0.5 mL  0.5 mL Intramuscular Once McVey, Madelaine Bhat, PA-C        Allergies  Allergen Reactions  . Azithromycin Shortness Of Breath and Swelling  . Claritin [Loratadine] Other (See Comments)    Discoloration of lips w/ swelling.  Marland Kitchen Penicillins Other (See Comments)    Has patient had a PCN reaction causing immediate rash,  facial/tongue/throat swelling, SOB or lightheadedness with hypotension: unknown Has patient had a PCN reaction causing severe rash involving mucus membranes or skin necrosis: unknown Has patient had a PCN reaction that required hospitalization unknown Has patient had a PCN reaction occurring within the last 10 years: yes If all of the above answers are "NO", then may proceed with Cephalosporin use.   . Prozac [Fluoxetine Hcl]     Throat swelling    Review of Systems  Constitutional: Negative.   HENT: Negative.   Eyes: Negative.   Respiratory: Negative.   Cardiovascular: Negative.   Gastrointestinal: Negative.   Genitourinary: Negative.   Musculoskeletal: Negative.   Skin: Negative.   Neurological: Negative.   Endo/Heme/Allergies: Bruises/bleeds easily.  Psychiatric/Behavioral: Negative.   All other systems reviewed and are negative.      Objective:    Physical Exam  Constitutional: She is oriented to person, place, and time. She appears well-developed and well-nourished. No distress.  Cardiovascular: Normal rate and regular rhythm.  Pulmonary/Chest: Effort normal. No respiratory distress.  Musculoskeletal: Normal range of motion.        General: Tenderness and edema present. No deformity.  Neurological: She is alert and oriented to person, place, and time.  Skin: Skin is warm and dry. No rash noted. She is not diaphoretic. There is erythema. No pallor.     Psychiatric: She has a normal mood and affect. Her behavior is normal. Judgment and thought content normal.  Nursing note and vitals reviewed.   BP (!) 140/95   Pulse 72   Temp 98.6 F (37 C)   Resp 12   Ht 5\' 4"  (1.626 m)   Wt 200 lb (90.7 kg)   SpO2 97%   BMI 34.33 kg/m  Wt Readings from Last 3 Encounters:  01/01/19 200 lb (90.7 kg)  11/02/17 202 lb 6.4 oz (91.8 kg)  08/07/17 205 lb (93 kg)    Health Maintenance Due  Topic Date Due  . HIV Screening  09/04/1979  . PAP SMEAR-Modifier  01/24/2018  .  INFLUENZA VACCINE  08/25/2018    There are no preventive care reminders to display for this patient.   Lab Results  Component Value Date   TSH 0.911 05/05/2016   Lab Results  Component Value Date   WBC 8.9 02/03/2017   HGB 15.3 02/03/2017   HCT 46.8  02/03/2017   MCV 90.8 02/03/2017   PLT 322 11/09/2016   Lab Results  Component Value Date   NA 141 11/09/2016   K 4.6 11/09/2016   CO2 23 11/09/2016   GLUCOSE 92 11/09/2016   BUN 10 11/09/2016   CREATININE 1.01 (H) 11/09/2016   BILITOT <0.2 07/25/2016   ALKPHOS 77 07/25/2016   AST 24 07/25/2016   ALT 54 (H) 07/25/2016   PROT 6.3 07/25/2016   ALBUMIN 3.6 07/25/2016   CALCIUM 9.8 11/09/2016   ANIONGAP 9 07/17/2016   No results found for: CHOL No results found for: HDL No results found for: LDLCALC No results found for: TRIG No results found for: CHOLHDL Lab Results  Component Value Date   HGBA1C 5.6 01/01/2014       Assessment & Plan:   Problem List Items Addressed This Visit    None    Visit Diagnoses    Long term current use of anticoagulant therapy    -  Primary   Relevant Orders   VAS [place] LOWER EXTREMITY VENOUS (DVT)       No orders of the defined types were placed in this encounter.  PLAN  Concern for DVT. May be superficial clot - but given long term anticoagulant therapy, and that I have not seen this patient previously, suggest ED visit for assessment via [place].  Order placed stat to [facility] Pontes to facilitate process  Pt has a number of health concerns and does not want to go to ED at this time. Explained risks and benefits of not proceeding to ED, including pulmonary embolism, heart attack, and stroke. Pt demonstrates understanding of these risks and states that she will not wait long in the ED before leaving.  Patient encouraged to call clinic with any questions, comments, or concerns.    Maximiano Coss, NP

## 2019-01-01 NOTE — Progress Notes (Signed)
RLE venous duplex       has been completed. Preliminary results can be found under CV proc through chart review. Quandarius Nill, BS, RDMS, RVT   

## 2019-01-01 NOTE — ED Provider Notes (Signed)
MSE was initiated and I personally evaluated the patient and placed orders (if any) at  4:39 PM on January 01, 2019.  The patient appears stable so that the remainder of the MSE may be completed by another provider.  Patient presents today requesting a ultrasound of the right calf.  She was sen by her PCP who referred her here.  She takes plavix and aspirin and frequently bruises.  She denies any CP or Shob.  It has been there for 2-3 days.   2+ PT pulse.   DVT study ordered.    Patient is aware that her care has been initiaited.  We discussed the importance of staying for full evaluation and risks of failing to do so and she states her understanding.       Lorin Glass, PA-C 01/01/19 1642    Lajean Saver, MD 01/01/19 2303

## 2019-01-02 ENCOUNTER — Inpatient Hospital Stay (HOSPITAL_COMMUNITY): Admission: RE | Admit: 2019-01-02 | Payer: BC Managed Care – PPO | Source: Ambulatory Visit

## 2021-03-20 ENCOUNTER — Encounter (HOSPITAL_COMMUNITY): Payer: Self-pay | Admitting: *Deleted

## 2021-03-20 ENCOUNTER — Emergency Department (HOSPITAL_COMMUNITY)
Admission: EM | Admit: 2021-03-20 | Discharge: 2021-03-20 | Disposition: A | Discharge status: Home / Self Care | Payer: BC Managed Care – PPO | Attending: Emergency Medicine | Admitting: Emergency Medicine

## 2021-03-20 ENCOUNTER — Other Ambulatory Visit: Payer: Self-pay

## 2021-03-20 ENCOUNTER — Telehealth: Payer: Self-pay

## 2021-03-20 DIAGNOSIS — I10 Essential (primary) hypertension: Secondary | ICD-10-CM | POA: Insufficient documentation

## 2021-03-20 DIAGNOSIS — Z7982 Long term (current) use of aspirin: Secondary | ICD-10-CM | POA: Diagnosis not present

## 2021-03-20 DIAGNOSIS — M25512 Pain in left shoulder: Secondary | ICD-10-CM | POA: Diagnosis present

## 2021-03-20 DIAGNOSIS — Z7902 Long term (current) use of antithrombotics/antiplatelets: Secondary | ICD-10-CM | POA: Diagnosis not present

## 2021-03-20 MED ORDER — LIDOCAINE 5 % EX PTCH: 1.0000 | MEDICATED_PATCH | CUTANEOUS | 0 refills | Status: AC

## 2021-03-20 MED ORDER — OXYCODONE-ACETAMINOPHEN 5-325 MG PO TABS
1.0000 | ORAL_TABLET | Freq: Once | ORAL | Status: AC
  Administered 2021-03-20: 1 via ORAL
  Filled 2021-03-20: qty 1

## 2021-03-20 MED ORDER — LIDOCAINE 5 % EX PTCH
1.0000 | MEDICATED_PATCH | CUTANEOUS | Status: DC
  Administered 2021-03-20: 1 via TRANSDERMAL
  Filled 2021-03-20: qty 1

## 2021-03-20 MED ORDER — METHOCARBAMOL 500 MG PO TABS: 500.0000 mg | ORAL_TABLET | Freq: Two times a day (BID) | ORAL | 0 refills | Status: AC

## 2021-03-20 MED ORDER — METHOCARBAMOL 500 MG PO TABS
500.0000 mg | ORAL_TABLET | Freq: Once | ORAL | Status: AC
  Administered 2021-03-20: 500 mg via ORAL
  Filled 2021-03-20: qty 1

## 2021-03-20 NOTE — Discharge Instructions (Addendum)
Take the prescribed medication as directed.  Can use heat/ice on affected area as well. Talk to your neurologist about your plavix. Follow-up with your primary care doctor. Return to the ED for new or worsening symptoms.

## 2021-03-20 NOTE — ED Provider Notes (Signed)
[facility] EMERGENCY DEPARTMENT Provider Note   CSN: [id number] Arrival date & time: 03/20/21  0423     History  Chief Complaint  Patient presents with   Arm Pain    [name] is a 56 y.o. female.  The history is provided by the patient and medical records.  Arm Pain  56 y.o. F with hx of chronic neck and back pain, HTN, obesity, presenting to the ED for left shoulder pain.  States woke up this morning in a lot of pain from left shoulder that radiates down left arm to the elbow and across the shoulder blade towards her right side.  She denies any known injury, trauma, or falls.  She has not done any heavy lifting, pushing, or pulling.  She is a side sleeper and thought maybe she was in an awkward position in the middle of the night.  States it feels very "tight" across her shoulder blades.  Pain worse with movement.  States she is concerned because she was recently taken off her Plavix (by neurology) due to dysfunctional uterine bleeding.  She is still on daily aspirin therapy.  She denies any new focal numbness, weakness, blurred vision, dizziness, confusion.  She did take some Tylenol for pain.  Denies chest pain, SOB, fever, cough, or other URI symptoms.  Home Medications Prior to Admission medications   Medication Sig Start Date End Date Taking? Authorizing Provider  aspirin EC 325 MG tablet Take by mouth. 10/30/17   [provider]  cetirizine (ZYRTEC) 10 MG tablet TAKE 1 TABLET(10 MG) BY MOUTH DAILY 10/18/17   [provider]  clopidogrel (PLAVIX) 75 MG tablet TAKE 1 TABLET(75 MG) BY MOUTH DAILY 01/04/18   [provider]  escitalopram (LEXAPRO) 10 MG tablet Take 10 mg by mouth daily.    [provider]  gabapentin (NEURONTIN) 300 MG capsule  12/08/18   [provider]  lidocaine (XYLOCAINE) 5 % ointment  09/29/17   [provider]  methocarbamol (ROBAXIN) 750 MG tablet Take 750 mg by mouth 4 (four)  times daily.    [provider]  Omega-3 Fatty Acids (FISH OIL) 1000 MG CAPS Take by mouth.    [provider]  omeprazole (PRILOSEC) 20 MG capsule Take by mouth.    [provider]  phentermine 15 MG capsule Take 1 capsule (15 mg total) by mouth every morning. 07/12/17   McVey, Madelaine Bhat, PA-C  vitamin E 100 UNIT capsule Take by mouth.    [provider]      Allergies    Azithromycin, Claritin [loratadine], Penicillins, and Prozac [fluoxetine hcl]    Review of Systems   Review of Systems  Musculoskeletal:  Positive for arthralgias.  All other systems reviewed and are negative.  Physical Exam Updated Vital Signs BP (!) 157/87    Pulse 87    Temp 97.9 F (36.6 C) (Oral)    Resp 18    LMP 09/11/2017 Comment: perimenopausal   SpO2 96%   Physical Exam Vitals and nursing note reviewed.  Constitutional:      Appearance: She is well-developed.  HENT:     Head: Normocephalic and atraumatic.  Eyes:     Conjunctiva/sclera: Conjunctivae normal.     Pupils: Pupils are equal, round, and reactive to light.  Cardiovascular:     Rate and Rhythm: Normal rate and regular rhythm.     Heart sounds: Normal heart sounds.  Pulmonary:     Effort: Pulmonary effort  is normal.     Breath sounds: Normal breath sounds.  Abdominal:     General: Bowel sounds are normal.     Palpations: Abdomen is soft.  Musculoskeletal:        General: Normal range of motion.     Cervical back: Normal range of motion.     Comments: Pain elicited with palpation along left posterior shoulder and across upper back, is able to range elbow, wrists, and fingers, pain elicited with abduction of left shoulder, radial pulses intact, equal grips bilaterally  Skin:    General: Skin is warm and dry.  Neurological:     Mental Status: She is alert and oriented to person, place, and time.    ED Results / Procedures / Treatments   Labs (all labs ordered are listed, but only abnormal  results are displayed) Labs Reviewed - No data to display  EKG EKG Interpretation  Date/Time:  Saturday March 20 2021 04:33:40 EST Ventricular Rate:  78 PR Interval:  130 QRS Duration: 70 QT Interval:  392 QTC Calculation: 446 R Axis:   81 Text Interpretation: Normal sinus rhythm Nonspecific T wave abnormality Abnormal ECG When compared with ECG of 17-Jul-2016 04:45, PREVIOUS ECG IS PRESENT Confirmed by Marily Memos [phone]) on 03/20/2021 4:37:31 AM  Radiology No results found.  Procedures Procedures    Medications Ordered in ED Medications  lidocaine (LIDODERM) 5 % 1 patch (has no administration in time range)  methocarbamol (ROBAXIN) tablet 500 mg (500 mg Oral Given 03/20/21 0511)  oxyCODONE-acetaminophen (PERCOCET/ROXICET) 5-325 MG per tablet 1 tablet (1 tablet Oral Given 03/20/21 6045)    ED Course/ Medical Decision Making/ A&P                           Medical Decision Making Risk Prescription drug management.   56 year old female presenting to the ED with left shoulder pain.  Woke up with this this morning.  Radiates down to the left elbow and across shoulder blades to right shoulder.  States it feels "tight".  She denies any injury, trauma, or falls.  Question if she slept wrong.  Does admit to chronic neck and back problems over the years. On exam, she has pain with palpation of the left posterior shoulder and into the shoulder blades.  Pain with abduction of left shoulder but has normal grip strength and both arms are neurovascularly intact.  She has concern for stroke possibility as she was recently taken off her Plavix by neurology due to dysfunctional uterine bleeding.  Her neurologic exam is reassuring without focal deficits and clinically her symptoms are not consistent with TIA/CVA  which I have discussed with her.  I feel this is likely more musculoskeletal.  Symptoms seem diffuse across upper back, not really correlating to one spinal level and no recent trauma  to suggest acute fracture/subluxation.  Imaging deferred at this time.  Will give trial of meds here and reassess.  5:57 AM Pain has improved some with medications.  States it is just "nagging".  Remains without focal deficits.  Still suspect this is likely MSK.  Will plan to d/c home with symptomatic care.  She has a lot of concerns regarding her plavix, have asked her to speak with her neurologist about this.  She will follow-up with PCP.  Can return here for new concerns.  Final Clinical Impression(s) / ED Diagnoses Final diagnoses:  Acute pain of left shoulder    Rx / DC Orders  ED Discharge Orders          Ordered    lidocaine (LIDODERM) 5 %  Every 24 hours        03/20/21 0559    methocarbamol (ROBAXIN) 500 MG tablet  2 times daily        03/20/21 0559              Garlon Hatchet, PA-C 03/20/21 0606    Mesner, Barbara Cower, MD 03/20/21 [phone]

## 2021-03-20 NOTE — ED Notes (Addendum)
Pt declined heat or ice pack at this time

## 2021-03-20 NOTE — Telephone Encounter (Signed)
So calling in to state that patient is in severe pain. Percocet has worn off, lidocaine nad muscle relaxant , ice tylenol not heloping. Discussed return precautions. Patient does not want to come back in , voiced upset over why pain medication was not ordered. This RNCM can only message provider, which was done, to see if they will e send something in.

## 2021-03-20 NOTE — ED Triage Notes (Signed)
Pt c/o left arm pain radiating across back into R arm. Denies chest pain or any injury

## 2021-03-20 NOTE — ED Notes (Signed)
Patient verbalizes understanding of discharge instructions. Opportunity for questioning and answers were provided. Armband removed by staff, pt discharged from ED ambulatory.   

## 2021-03-20 NOTE — ED Notes (Signed)
PA at bedside.

## 2021-03-21 ENCOUNTER — Other Ambulatory Visit: Payer: Self-pay

## 2021-03-21 ENCOUNTER — Ambulatory Visit (HOSPITAL_COMMUNITY)
Admission: EM | Admit: 2021-03-21 | Discharge: 2021-03-21 | Discharge status: Against Medical Advice | Payer: BC Managed Care – PPO

## 2021-03-21 NOTE — ED Notes (Signed)
Listed as lwbs by patient access staff

## 2021-05-11 ENCOUNTER — Ambulatory Visit: Payer: BC Managed Care – PPO | Admitting: Family Medicine

## 2023-08-31 ENCOUNTER — Ambulatory Visit: Admitting: Family Medicine

## 2023-08-31 ENCOUNTER — Ambulatory Visit: Payer: Medicare Other | Admitting: Family Medicine

## 2023-08-31 VITALS — BP 138/76 | HR 75 | Temp 98.2°F | Ht 64.0 in | Wt 222.0 lb

## 2023-08-31 DIAGNOSIS — Z7689 Persons encountering health services in other specified circumstances: Secondary | ICD-10-CM | POA: Diagnosis not present

## 2023-08-31 DIAGNOSIS — I6523 Occlusion and stenosis of bilateral carotid arteries: Secondary | ICD-10-CM

## 2023-08-31 DIAGNOSIS — S83242D Other tear of medial meniscus, current injury, left knee, subsequent encounter: Secondary | ICD-10-CM

## 2023-08-31 DIAGNOSIS — F32A Depression, unspecified: Secondary | ICD-10-CM

## 2023-08-31 DIAGNOSIS — M722 Plantar fascial fibromatosis: Secondary | ICD-10-CM

## 2023-08-31 DIAGNOSIS — I10 Essential (primary) hypertension: Secondary | ICD-10-CM

## 2023-08-31 DIAGNOSIS — K5790 Diverticulosis of intestine, part unspecified, without perforation or abscess without bleeding: Secondary | ICD-10-CM

## 2023-08-31 DIAGNOSIS — H9193 Unspecified hearing loss, bilateral: Secondary | ICD-10-CM

## 2023-08-31 DIAGNOSIS — H6121 Impacted cerumen, right ear: Secondary | ICD-10-CM

## 2023-08-31 DIAGNOSIS — F419 Anxiety disorder, unspecified: Secondary | ICD-10-CM

## 2023-08-31 DIAGNOSIS — M159 Polyosteoarthritis, unspecified: Secondary | ICD-10-CM

## 2023-08-31 DIAGNOSIS — N951 Menopausal and female climacteric states: Secondary | ICD-10-CM

## 2023-08-31 DIAGNOSIS — M545 Low back pain, unspecified: Secondary | ICD-10-CM

## 2023-08-31 NOTE — Patient Instructions (Signed)
 It was nice meeting you today.  We will try to get the records from the [facility].  We can set up f/u after that.

## 2023-08-31 NOTE — Progress Notes (Signed)
 Established Patient Office Visit   Subjective  Patient ID: [name], female    DOB: 02-18-1965  Age: 58 y.o. MRN: [id number]  No chief complaint on file. Pt accompanied by her husband.  Pt is a 58 yo female seen to est care and for chronic concerns.  Pt previously seen by Benton Parsons, PA-C and at the [facility].  She has a history of bilateral carotid artery disease, with one artery 70% blocked and the other 60%. She was previously treated with Plavix but discontinued due to bleeding issues and is currently on aspirin 80 mg and 325 mg daily.  She experiences arthritis in her knee and back, with associated bone loss in the lower back. Has a torn meniscus in left knee, and issues with the right knee. Physical therapy at the [facility] was attempted but caused significant pain, leading to discontinuation. Pain extends from her hips down, with a sensation that her legs might give out.  She has a history of Achilles tendon issues, including a torn L achilles tendon. Surgery was performed on her left foot to remove a growth on the ball of the foot, and a similar growth is present on the right foot, which she has not addressed due to recovery concerns.  Dealing with anxiety and depression since the late 1980s, with multiple medication changes and ongoing symptoms. Despite seeing both a psychiatrist and a psychologist, medications have not been very effective.  History of diverticulosis, identified during a colonoscopy last year, and has adjusted her diet to avoid flare-ups, such as avoiding cashew nuts.  She reports cataracts and decreased hearing, with a recommendation for a hearing aid that has not yet been obtained. She has a sleep apnea machine but finds it uncomfortable and has trouble sleeping with it due to her movement during sleep.  She experiences menopausal symptoms, including hot flashes and night sweats, and has tried estrogen therapy without success. Her mother does not recall experiencing  menopause, leaving her without familial guidance on the duration of symptoms.  She has allergies to Prozac , penicillin, Claritin, contrast dye, and possibly azithromycin , with reactions including throat swelling and rashes.  Her diet includes a lot of meat, vegetables, fruits, and potato chips. She engages in limited physical activity due to pain, using a vibration platform and performing seated exercises. Water aerobics were attempted but resulted in increased pain.  She takes several herbal supplements, including Sea moss, Sarasop, black seed, mullein extract, vitamin C, and magnesium.    Patient Active Problem List   Diagnosis Date Noted   Facet arthropathy of spine 11/13/2017   Back pain, chronic 11/13/2017   Depression 11/13/2017   Essential hypertension 05/05/2016   Chronic arthralgias of knees and hips 05/05/2016   Cervical disc disorder with radiculopathy of cervical region 12/01/2015   Tobacco user 07/18/2013   Obesity, unspecified 07/18/2013   Seasonal allergies 07/18/2013   S/P cervical spinal fusion 02/27/2012   Past Medical History:  Diagnosis Date   Allergy    Anxiety 1986   Arthritis    Depression 1986   Diabetes mellitus without complication (HCC) 2024   Hypertension    Neuromuscular disorder (HCC) 1990   Osteopenia    Osteoporosis 2019   Sleep apnea    Past Surgical History:  Procedure Laterality Date   SPINAL CORD DECOMPRESSION N/A 09/16/2010   Cervical spine- Dr. Beuford   SPINAL FUSION N/A 09/16/2010   Cervical spine- Dr. Beuford   Social History   Tobacco Use  Smoking status: Every Day    Current packs/day: 0.25    Average packs/day: 0.3 packs/day for 38.7 years (10.0 ttl pk-yrs)    Types: Cigarettes   Smokeless tobacco: Never  Substance Use Topics   Alcohol use: Yes    Alcohol/week: 2.0 standard drinks of alcohol    Types: 2 Glasses of wine per week    Comment: occasional wine on weekends   Drug use: Yes    Types: Marijuana    Comment:  Cannabis and different kinds of Hemp products, such as oils   Family History  Problem Relation Age of Onset   COPD Father    Anxiety disorder Sister    Cancer Brother    Breast cancer Maternal Aunt    Breast cancer Maternal Grandmother    Cancer Maternal Grandmother    Breast cancer Paternal Aunt    Allergies  Allergen Reactions   Azithromycin  Shortness Of Breath and Swelling   Claritin [Loratadine] Other (See Comments)    Discoloration of lips w/ swelling.   Penicillins Other (See Comments)    Has patient had a PCN reaction causing immediate rash, facial/tongue/throat swelling, SOB or lightheadedness with hypotension: unknown Has patient had a PCN reaction causing severe rash involving mucus membranes or skin necrosis: unknown Has patient had a PCN reaction that required hospitalization unknown Has patient had a PCN reaction occurring within the last 10 years: yes If all of the above answers are NO, then may proceed with Cephalosporin use.    Prozac  [Fluoxetine  Hcl]     Throat swelling    ROS Negative unless stated above    Objective:     BP 138/76 (BP Location: Left Arm, Patient Position: Sitting, Cuff Size: Large)   Pulse 75   Temp 98.2 F (36.8 C) (Oral)   Ht 5' 4 (1.626 m)   Wt 222 lb (100.7 kg)   LMP 09/11/2017 Comment: perimenopausal  SpO2 98%   BMI 38.11 kg/m  BP Readings from Last 3 Encounters:  08/31/23 138/76  03/20/21 (!) 155/95  01/01/19 (!) 160/94   Wt Readings from Last 3 Encounters:  08/31/23 222 lb (100.7 kg)  01/01/19 200 lb (90.7 kg)  11/02/17 202 lb 6.4 oz (91.8 kg)      Physical Exam Constitutional:      General: She is not in acute distress.    Appearance: Normal appearance.  HENT:     Head: Normocephalic and atraumatic.     Right Ear: There is impacted cerumen.     Nose: Nose normal.     Mouth/Throat:     Mouth: Mucous membranes are moist.  Cardiovascular:     Rate and Rhythm: Normal rate and regular rhythm.     Heart  sounds: Normal heart sounds. No murmur heard.    No gallop.  Pulmonary:     Effort: Pulmonary effort is normal. No respiratory distress.     Breath sounds: Normal breath sounds. No wheezing, rhonchi or rales.  Skin:    General: Skin is warm and dry.  Neurological:     Mental Status: She is alert and oriented to person, place, and time.        08/31/2023    3:58 PM 01/01/2019    3:11 PM 11/02/2017   11:19 AM  Depression screen PHQ 2/9  Decreased Interest 3 2 0  Down, Depressed, Hopeless 2 1 0  PHQ - 2 Score 5 3 0  Altered sleeping 1 3   Tired, decreased energy 1 2  Change in appetite 1 2   Feeling bad or failure about yourself  0 1   Trouble concentrating 2 2   Moving slowly or fidgety/restless 0 2   Suicidal thoughts 0 0   PHQ-9 Score 10 15   Difficult doing work/chores Extremely dIfficult        08/31/2023    3:57 PM 01/01/2019    3:12 PM  GAD 7 : Generalized Anxiety Score  Nervous, Anxious, on Edge 3 3  Control/stop worrying 1 2  Worry too much - different things 1 2  Trouble relaxing 2 3  Restless 1 2  Easily annoyed or irritable 2 2  Afraid - awful might happen 0 2  Total GAD 7 Score 10 16  Anxiety Difficulty Extremely difficult      No results found for any visits on 08/31/23.    Assessment & Plan:   Essential hypertension  Encounter to establish care  Bilateral hearing loss, unspecified hearing loss type  Bilateral carotid artery occlusion  Impacted cerumen of right ear  Chronic midline low back pain, unspecified whether sciatica present  Diverticulosis  Anxiety and depression  Plantar fascial fibromatosis of right foot  Tear of medial meniscus of left knee, current, unspecified tear type, subsequent encounter  Osteoarthritis of multiple joints, unspecified osteoarthritis type  Carotid artery disease with bilateral artery occlusion   She has bilateral carotid artery stenosis with one artery 70% occluded and the other 60%. Previously on  Plavix, she is now on aspirin due to bleeding concerns. Continue aspirin therapy with 80 mg and 325 mg doses.  F/u with Cardiology.  Place referral if needed.  Osteoarthritis and degenerative joint disease involving knees and spine   Chronic osteoarthritis in her knees and spine causes pain and limits mobility. Physical therapy previously exacerbated her pain.  F/u with Ortho.  Chronic low back pain with lumbar degenerative disc disease   Her chronic low back pain is due to lumbar degenerative disc disease, with pain management challenging as activity exacerbates the pain. F/u with Ortho.  Torn medial meniscus, left knee   She has a torn medial meniscus in her left knee with arthritis, causing pain and mobility issues.  F/u with Ortho.  Left Achilles tendon tear   She has a chronic left Achilles tendon tear with gradual worsening.  Ortho  Plantar fibromatosis, status post excision left, active right   Plantar fibromatosis is present with previous excision on the left foot and active growth on the right foot. She is hesitant about surgery due to recovery time.  History of cervical disc replacement   Her cervical disc replacement is intact, but there are concerns about her lower spine discs.  Obesity   Body mass index is 38.11 kg/m.  Limited exercise due to pain and mobility issues. Consider wt management referral.  Diverticulosis of colon   Diverticulosis is managed with dietary modifications, confirmed by colonoscopy.  Hearing loss, unspecified   She has unspecified hearing loss with a recommendation for a hearing aid. There are delays in acquisition through the [facility]. Advised to pursue getting them.  Cataract, unspecified   F/u with Ophthalmology for monitoring vs removal.  Depression and anxiety disorders   She has long-standing depression and anxiety since 1986-87. Current medications are ineffective, with limited improvement from mental health professionals. Consider GeneSight  testing.  Menopausal symptoms (hot flashes, night sweats)   Severe menopausal symptoms are present, with previous estrogen therapy ineffective.  Obstructive sleep apnea, poor CPAP tolerance  She has obstructive sleep apnea with poor CPAP tolerance and significant snoring.  -We reviewed the PMH, PSH, FH, SH, Meds and Allergies. -We provided refills for any medications we will prescribe as needed. -We addressed current concerns per orders and patient instructions. -We have asked for records for pertinent exams, studies, vaccines and notes from previous providers. -We have advised patient to follow up per instructions below.  I personally spent a total of 45 minutes in the care of the patient today including getting/reviewing separately obtained history, performing a medically appropriate exam/evaluation, counseling and educating, documenting clinical information in the EHR, and coordinating care.  Return in about 8 weeks (around 10/26/2023).   Clotilda JONELLE Single, MD

## 2023-09-04 DIAGNOSIS — M159 Polyosteoarthritis, unspecified: Secondary | ICD-10-CM | POA: Insufficient documentation

## 2023-09-04 DIAGNOSIS — H9193 Unspecified hearing loss, bilateral: Secondary | ICD-10-CM | POA: Insufficient documentation

## 2023-09-04 DIAGNOSIS — K5790 Diverticulosis of intestine, part unspecified, without perforation or abscess without bleeding: Secondary | ICD-10-CM | POA: Insufficient documentation

## 2023-09-04 DIAGNOSIS — M722 Plantar fascial fibromatosis: Secondary | ICD-10-CM | POA: Insufficient documentation

## 2023-09-04 DIAGNOSIS — I6523 Occlusion and stenosis of bilateral carotid arteries: Secondary | ICD-10-CM | POA: Insufficient documentation

## 2023-09-04 DIAGNOSIS — S83242A Other tear of medial meniscus, current injury, left knee, initial encounter: Secondary | ICD-10-CM | POA: Insufficient documentation

## 2023-10-26 ENCOUNTER — Ambulatory Visit: Admitting: Family Medicine

## 2023-11-10 ENCOUNTER — Ambulatory Visit: Admitting: Family Medicine

## 2023-11-17 ENCOUNTER — Encounter: Payer: Self-pay | Admitting: Family Medicine

## 2023-11-17 ENCOUNTER — Ambulatory Visit (INDEPENDENT_AMBULATORY_CARE_PROVIDER_SITE_OTHER): Admitting: Family Medicine

## 2023-11-17 VITALS — BP 168/92 | HR 85 | Temp 98.0°F | Ht 64.0 in | Wt 217.4 lb

## 2023-11-17 DIAGNOSIS — M159 Polyosteoarthritis, unspecified: Secondary | ICD-10-CM

## 2023-11-17 DIAGNOSIS — I10 Essential (primary) hypertension: Secondary | ICD-10-CM | POA: Diagnosis not present

## 2023-11-17 DIAGNOSIS — E66812 Obesity, class 2: Secondary | ICD-10-CM

## 2023-11-17 DIAGNOSIS — F419 Anxiety disorder, unspecified: Secondary | ICD-10-CM

## 2023-11-17 DIAGNOSIS — M797 Fibromyalgia: Secondary | ICD-10-CM | POA: Diagnosis not present

## 2023-11-17 DIAGNOSIS — F32A Depression, unspecified: Secondary | ICD-10-CM

## 2023-11-17 DIAGNOSIS — I6523 Occlusion and stenosis of bilateral carotid arteries: Secondary | ICD-10-CM

## 2023-11-17 DIAGNOSIS — R413 Other amnesia: Secondary | ICD-10-CM

## 2023-11-17 DIAGNOSIS — Z6835 Body mass index (BMI) 35.0-35.9, adult: Secondary | ICD-10-CM

## 2023-11-17 NOTE — Patient Instructions (Signed)
 Behavioral Health Services: -to make an appointment contact the office/provider you are interested in seeing.  No referral is needed.  The below is not an all inclusive list, but will help you get started.  [email or web address] -counseling located off of [address]  [email or web address] -website helps you find providers in your area  [facility] -Located off of [address]. across from [place] [address]  Dr. Jannifer Franklin is a Therapist, sports with [facility]. [phone]  [facility]  [facility]  -[address]  [phone] -a place in town that has counseling and Psychiatry services.    [facility] [address] [place], [place] [phone]  [facility] [address], [address] [place], [place] [phone]  [facility] [address], [place] [address] [phone]  Dr. Alanson Aly [facility] [address] [place], [place] [address] [phone]  Dr. Archer Asa [facility] [address] [place] [place], [place] [address] [phone]

## 2023-11-17 NOTE — Progress Notes (Signed)
 Established Patient Office Visit   Subjective  Patient ID: [name], female    DOB: 1965/12/27  Age: 58 y.o. MRN: [id number]  Chief Complaint  Patient presents with   Medical Management of Chronic Issues    Patient came in today for a follow-up for Blood pressure, anxiety, obesity   Pt accompanied by her husband.  Pt is a 58 year old female with fibromyalgia, arthritis, and coronary artery disease who presents with worsening pain and management of her chronic conditions.  Pt also seen at the [facility].  H/o fibromyalgia and arthritis, affecting multiple joints including her knees and shoulders.   Increased aches and pain likely from weather.  Has received injections in her shoulder, which provided some relief. Scheduled for another nerve block in December and prefers non-surgical interventions due to fear of surgery. Rx'd prednisone , has not yet started due to her usual lack of morning appetite, but plans to take her first dose later today.  Seen by [facility].  H/o CAD with b/l artery occlusions.  Previous stent placement attempt aborted due to high risk.  Pt d/c'd cholesterol medication due to side effects and forgetfulness.  Is exploring dietary changes to manage her cholesterol.  Caring for her elderly father, which adds to her stress and impacts her ability to focus on her own health. She acknowledges neglecting her own health needs while prioritizing her caregiving responsibilities.  H/o high blood pressure, which she monitors infrequently.   She has been provided with equipment to check her blood sugar but does not regularly use it.    Patient Active Problem List   Diagnosis Date Noted   Osteoarthritis of multiple joints 09/04/2023   Tear of medial meniscus of left knee, current 09/04/2023   Plantar fascial fibromatosis of right foot 09/04/2023   Diverticulosis 09/04/2023   Bilateral carotid artery occlusion 09/04/2023   Bilateral hearing loss 09/04/2023   Facet arthropathy of  spine 11/13/2017   Back pain, chronic 11/13/2017   Depression 11/13/2017   Essential hypertension 05/05/2016   Chronic arthralgias of knees and hips 05/05/2016   Cervical disc disorder with radiculopathy of cervical region 12/01/2015   Tobacco user 07/18/2013   Obesity, unspecified 07/18/2013   Seasonal allergies 07/18/2013   S/P cervical spinal fusion 02/27/2012   Past Medical History:  Diagnosis Date   Allergy    Anxiety 1986   Arthritis    Depression 1986   Diabetes mellitus without complication (HCC) 2024   Hypertension    Neuromuscular disorder (HCC) 1990   Osteopenia    Osteoporosis 2019   Sleep apnea    Past Surgical History:  Procedure Laterality Date   SPINAL CORD DECOMPRESSION N/A 09/16/2010   Cervical spine- Dr. Beuford   SPINAL FUSION N/A 09/16/2010   Cervical spine- Dr. Beuford   Social History   Tobacco Use   Smoking status: Every Day    Current packs/day: 0.25    Average packs/day: 0.3 packs/day for 38.7 years (10.0 ttl pk-yrs)    Types: Cigarettes   Smokeless tobacco: Never  Substance Use Topics   Alcohol use: Yes    Alcohol/week: 2.0 standard drinks of alcohol    Types: 2 Glasses of wine per week    Comment: occasional wine on weekends   Drug use: Yes    Types: Marijuana    Comment: Cannabis and different kinds of Hemp products, such as oils   Family History  Problem Relation Age of Onset   COPD Father    Anxiety  disorder Sister    Cancer Brother    Breast cancer Maternal Aunt    Breast cancer Maternal Grandmother    Cancer Maternal Grandmother    Breast cancer Paternal Aunt    Allergies  Allergen Reactions   Azithromycin  Shortness Of Breath and Swelling   Claritin [Loratadine] Other (See Comments)    Discoloration of lips w/ swelling.   Penicillins Other (See Comments)    Has patient had a PCN reaction causing immediate rash, facial/tongue/throat swelling, SOB or lightheadedness with hypotension: unknown Has patient had a PCN reaction  causing severe rash involving mucus membranes or skin necrosis: unknown Has patient had a PCN reaction that required hospitalization unknown Has patient had a PCN reaction occurring within the last 10 years: yes If all of the above answers are NO, then may proceed with Cephalosporin use.    Prozac  [Fluoxetine  Hcl]     Throat swelling    ROS Negative unless stated above    Objective:     BP (!) 168/90 (BP Location: Right Arm, Patient Position: Sitting, Cuff Size: Large)   Pulse 85   Temp 98 F (36.7 C) (Oral)   Ht 5' 4 (1.626 m)   Wt 217 lb 6.4 oz (98.6 kg)   LMP 09/11/2017 Comment: perimenopausal  SpO2 99%   BMI 37.32 kg/m  BP Readings from Last 3 Encounters:  11/17/23 (!) 168/90  08/31/23 138/76  03/20/21 (!) 155/95   Wt Readings from Last 3 Encounters:  11/17/23 217 lb 6.4 oz (98.6 kg)  08/31/23 222 lb (100.7 kg)  01/01/19 200 lb (90.7 kg)      Physical Exam Constitutional:      General: She is not in acute distress.    Appearance: Normal appearance.  HENT:     Head: Normocephalic and atraumatic.     Nose: Nose normal.     Mouth/Throat:     Mouth: Mucous membranes are moist.  Cardiovascular:     Rate and Rhythm: Normal rate and regular rhythm.     Heart sounds: Normal heart sounds. No murmur heard.    No gallop.  Pulmonary:     Effort: Pulmonary effort is normal. No respiratory distress.     Breath sounds: Normal breath sounds. No wheezing, rhonchi or rales.  Skin:    General: Skin is warm and dry.  Neurological:     Mental Status: She is alert and oriented to person, place, and time.  Psychiatric:        Attention and Perception: Attention normal.        Mood and Affect: Mood is depressed.        Speech: Speech normal.        Behavior: Behavior is cooperative.        Cognition and Memory: Memory is impaired.        11/17/2023   11:22 AM 08/31/2023    3:58 PM 01/01/2019    3:11 PM  Depression screen PHQ 2/9  Decreased Interest 2 3 2   Down,  Depressed, Hopeless 1 2 1   PHQ - 2 Score 3 5 3   Altered sleeping 3 1 3   Tired, decreased energy 2 1 2   Change in appetite 2 1 2   Feeling bad or failure about yourself  1 0 1  Trouble concentrating 2 2 2   Moving slowly or fidgety/restless 1 0 2  Suicidal thoughts 0 0 0  PHQ-9 Score 14  10  15    Difficult doing work/chores Very difficult Extremely dIfficult  Data saved with a previous flowsheet row definition      11/17/2023   11:23 AM 08/31/2023    3:57 PM 01/01/2019    3:12 PM  GAD 7 : Generalized Anxiety Score  Nervous, Anxious, on Edge 2 3 3   Control/stop worrying 2 1 2   Worry too much - different things 2 1 2   Trouble relaxing 2 2 3   Restless 1 1 2   Easily annoyed or irritable 1 2 2   Afraid - awful might happen 1 0 2  Total GAD 7 Score 11 10 16   Anxiety Difficulty Somewhat difficult Extremely difficult      No results found for any visits on 11/17/23.    Assessment & Plan:   Essential hypertension -     Ambulatory referral to Cardiology  Anxiety and depression  Class 2 obesity with body mass index (BMI) of 35.0 to 35.9 in adult, unspecified obesity type, unspecified whether serious comorbidity present  Fibromyalgia -Sertraline 100 mg daily, lidocaine  patch 5%  -     Ambulatory referral to Neurology  Memory loss -     Ambulatory referral to Neurology  Osteoarthritis of multiple joints, unspecified osteoarthritis type -celebrex 200 mg, sertraline 100 mg  Bilateral carotid artery occlusion -     Ambulatory referral to Cardiology  Awaiting records from [facility].  Pt unable to recall her meds due to memory deficit.  HTN uncontrolled.  Recheck bp this visit.  Continue sertraline 100 mg, prozosin 2 mg at bedtime.  Lifestyle modifications and stress reductions advised.  PHQ 9 score 14 and GAD 7 score 11 this visit.  Advised to set boundaries and look into counseling.  Continue sertraline 100 mg.  Body mass index is 37.32 kg/m.  Encouraged to increase physical  activity.   Diet changes also encouraged.  H/o memory deficit.  Referral to neurology for further eval.  Chronic pain from fibromyalgia and OA.  Supportive care including heat, topical meds, exercise, etc.  Referral to Cardiology to f/u on b/l carotid artery stenosis.    Return in about 4 months (around 03/19/2024).   Cassidy JONELLE Single, MD

## 2023-11-21 ENCOUNTER — Encounter: Payer: Self-pay | Admitting: Family Medicine

## 2023-12-11 NOTE — Addendum Note (Signed)
 Addended by: MERCER KIRSCH R on: 12/11/2023 06:14 PM   Modules accepted: Level of Service

## 2024-01-31 ENCOUNTER — Encounter: Payer: Self-pay | Admitting: Physician Assistant

## 2024-02-03 ENCOUNTER — Telehealth: Admitting: Nurse Practitioner

## 2024-02-03 DIAGNOSIS — J019 Acute sinusitis, unspecified: Secondary | ICD-10-CM

## 2024-02-03 DIAGNOSIS — B9689 Other specified bacterial agents as the cause of diseases classified elsewhere: Secondary | ICD-10-CM | POA: Diagnosis not present

## 2024-02-03 MED ORDER — PREDNISONE 20 MG PO TABS: 20.0000 mg | ORAL_TABLET | Freq: Every day | ORAL | 0 refills | Status: AC

## 2024-02-03 MED ORDER — DOXYCYCLINE HYCLATE 100 MG PO TABS: 100.0000 mg | ORAL_TABLET | Freq: Two times a day (BID) | ORAL | 0 refills | Status: AC

## 2024-02-03 NOTE — Patient Instructions (Addendum)
" °  [name], thank you for joining Haze LELON Servant, NP for today's virtual visit.  While this provider is not your primary care provider (PCP), if your PCP is located in our provider database this encounter information will be shared with them immediately following your visit.   A [facility] MyChart account gives you access to today's visit and all your visits, tests, and labs performed at [facility]  click here if you don't have a [facility] MyChart account or go to [email or web address]  Consent: (Patient) [name] provided verbal consent for this virtual visit at the beginning of the encounter.  Current Medications:  Current Outpatient Medications:    aspirin EC 325 MG tablet, Take by mouth., Disp: , Rfl:    escitalopram (LEXAPRO) 10 MG tablet, Take 10 mg by mouth daily., Disp: , Rfl:    gabapentin  (NEURONTIN ) 300 MG capsule, , Disp: , Rfl:    lidocaine  (LIDODERM ) 5 %, Place 1 patch onto the skin daily. Remove & Discard patch within 12 hours or as directed by MD, Disp: 14 patch, Rfl: 0   loratadine (CLARITIN) 10 MG tablet, Take 10 mg by mouth daily., Disp: , Rfl:    methocarbamol  (ROBAXIN ) 500 MG tablet, Take 1 tablet (500 mg total) by mouth 2 (two) times daily., Disp: 20 tablet, Rfl: 0   naloxone (NARCAN) nasal spray 4 mg/0.1 mL, Place into the nose., Disp: , Rfl:    Omega-3 Fatty Acids (FISH OIL) 1000 MG CAPS, Take by mouth., Disp: , Rfl:    omeprazole (PRILOSEC) 20 MG capsule, Take by mouth., Disp: , Rfl:    phentermine  15 MG capsule, Take 1 capsule (15 mg total) by mouth every morning., Disp: 30 capsule, Rfl: 2   prazosin (MINIPRESS) 2 MG capsule, Take 2 mg by mouth at bedtime., Disp: , Rfl:    vitamin E 100 UNIT capsule, Take by mouth., Disp: , Rfl:   Current Facility-Administered Medications:    Tdap (BOOSTRIX) injection 0.5 mL, 0.5 mL, Intramuscular, Once, McVey, Almarie Folks, PA-C   Medications ordered in this encounter:  No orders of  the defined types were placed in this encounter.    *If you need refills on other medications prior to your next appointment, please contact your pharmacy*  Follow-Up: Call back or seek an in-person evaluation if the symptoms worsen or if the condition fails to improve as anticipated.  [facility] [phone]  Other Instructions INSTRUCTIONS: use a humidifier for nasal congestion Drink plenty of fluids, rest and wash hands frequently to avoid the spread of infection Alternate tylenol  and Motrin  for relief of fever  Recommend neilMed sinus rinse as well    If you have been instructed to have an in-person evaluation today at a local Urgent Care facility, please use the link below. It will take you to a list of all of our available [facility], including address, phone number and hours of operation. Please do not delay care.  [facility]  If you or a family member do not have a primary care provider, use the link below to schedule a visit and establish care. When you choose a [facility] primary care physician or advanced practice provider, you gain a long-term partner in health. Find a Primary Care Provider  Learn more about [facility]'s in-office and virtual care options: [facility] Now recommend NeilMed sinus rinse "

## 2024-02-03 NOTE — Progress Notes (Signed)
 " Virtual Visit Consent   [name], you are scheduled for a virtual visit with a  provider today. Just as with appointments in the office, your consent must be obtained to participate. Your consent will be active for this visit and any virtual visit you may have with one of our providers in the next 365 days. If you have a MyChart account, a copy of this consent can be sent to you electronically.  As this is a virtual visit, video technology does not allow for your provider to perform a traditional examination. This may limit your provider's ability to fully assess your condition. If your provider identifies any concerns that need to be evaluated in person or the need to arrange testing (such as labs, EKG, etc.), we will make arrangements to do so. Although advances in technology are sophisticated, we cannot ensure that it will always work on either your end or our end. If the connection with a video visit is poor, the visit may have to be switched to a telephone visit. With either a video or telephone visit, we are not always able to ensure that we have a secure connection.  By engaging in this virtual visit, you consent to the provision of healthcare and authorize for your insurance to be billed (if applicable) for the services provided during this visit. Depending on your insurance coverage, you may receive a charge related to this service.  I need to obtain your verbal consent now. Are you willing to proceed with your visit today? [name] has provided verbal consent on 02/03/2024 for a virtual visit (video or telephone). Cassidy LELON Servant, NP  Date: 02/03/2024 2:55 PM   Virtual Visit via Video Note   I, [name], connected with  [name]  ([id number], 1965-08-17) on 02/03/2024 at  2:45 PM EST by a video-enabled telemedicine application and verified that I am speaking with the correct person using two identifiers.  Location: Patient: Virtual Visit Location  Patient: Home Provider: Virtual Visit Location Provider: Home Office   I discussed the limitations of evaluation and management by telemedicine and the availability of in person appointments. The patient expressed understanding and agreed to proceed.    History of Present Illness: [name] is a 59 y.o. who identifies as a female who was assigned female at birth, and is being seen today for sinsu symptoms .  Over the past 2 weeks [name] has been experiencing symptoms of sinus pressure, increased sinus drainage, facial swelling, cervical lymphadenopathy, ringing and pressure in her ears.    Problems:  Patient Active Problem List   Diagnosis Date Noted   Osteoarthritis of multiple joints 09/04/2023   Tear of medial meniscus of left knee, current 09/04/2023   Plantar fascial fibromatosis of right foot 09/04/2023   Diverticulosis 09/04/2023   Bilateral carotid artery occlusion 09/04/2023   Bilateral hearing loss 09/04/2023   Facet arthropathy of spine 11/13/2017   Back pain, chronic 11/13/2017   Depression 11/13/2017   Essential hypertension 05/05/2016   Chronic arthralgias of knees and hips 05/05/2016   Cervical disc disorder with radiculopathy of cervical region 12/01/2015   Tobacco user 07/18/2013   Obesity, unspecified 07/18/2013   Seasonal allergies 07/18/2013   S/P cervical spinal fusion 02/27/2012    Allergies: Allergies[1] Medications: Current Medications[2]  Observations/Objective: Patient is well-developed, well-nourished in no acute distress.  Resting comfortably at home.  Head is normocephalic, atraumatic.  No labored breathing.  Speech is clear and  coherent with logical content.  Patient is alert and oriented at baseline.    Assessment and Plan: 1. Acute bacterial sinusitis (Primary) - doxycycline  (VIBRA -TABS) 100 MG tablet; Take 1 tablet (100 mg total) by mouth 2 (two) times daily for 7 days.  Dispense: 14 tablet; Refill: 0 - predniSONE  (DELTASONE )  20 MG tablet; Take 1 tablet (20 mg total) by mouth daily with breakfast.  Dispense: 5 tablet; Refill: 0 Recommend NeilMed sinus rinse  Follow Up Instructions: I discussed the assessment and treatment plan with the patient. The patient was provided an opportunity to ask questions and all were answered. The patient agreed with the plan and demonstrated an understanding of the instructions.  A copy of instructions were sent to the patient via MyChart unless otherwise noted below.     The patient was advised to call back or seek an in-person evaluation if the symptoms worsen or if the condition fails to improve as anticipated.    Cassidy LELON Servant, NP     [1]  Allergies Allergen Reactions   Azithromycin  Shortness Of Breath and Swelling   Claritin [Loratadine] Other (See Comments)    Discoloration of lips w/ swelling.   Penicillins Other (See Comments)    Has patient had a PCN reaction causing immediate rash, facial/tongue/throat swelling, SOB or lightheadedness with hypotension: unknown Has patient had a PCN reaction causing severe rash involving mucus membranes or skin necrosis: unknown Has patient had a PCN reaction that required hospitalization unknown Has patient had a PCN reaction occurring within the last 10 years: yes If all of the above answers are NO, then may proceed with Cephalosporin use.    Prozac  [Fluoxetine  Hcl]     Throat swelling  [2]  Current Outpatient Medications:    doxycycline  (VIBRA -TABS) 100 MG tablet, Take 1 tablet (100 mg total) by mouth 2 (two) times daily for 7 days., Disp: 14 tablet, Rfl: 0   predniSONE  (DELTASONE ) 20 MG tablet, Take 1 tablet (20 mg total) by mouth daily with breakfast., Disp: 5 tablet, Rfl: 0   aspirin EC 325 MG tablet, Take by mouth., Disp: , Rfl:    escitalopram (LEXAPRO) 10 MG tablet, Take 10 mg by mouth daily., Disp: , Rfl:    gabapentin  (NEURONTIN ) 300 MG capsule, , Disp: , Rfl:    lidocaine  (LIDODERM ) 5 %, Place 1 patch onto the  skin daily. Remove & Discard patch within 12 hours or as directed by MD, Disp: 14 patch, Rfl: 0   loratadine (CLARITIN) 10 MG tablet, Take 10 mg by mouth daily., Disp: , Rfl:    methocarbamol  (ROBAXIN ) 500 MG tablet, Take 1 tablet (500 mg total) by mouth 2 (two) times daily., Disp: 20 tablet, Rfl: 0   naloxone (NARCAN) nasal spray 4 mg/0.1 mL, Place into the nose., Disp: , Rfl:    Omega-3 Fatty Acids (FISH OIL) 1000 MG CAPS, Take by mouth., Disp: , Rfl:    omeprazole (PRILOSEC) 20 MG capsule, Take by mouth., Disp: , Rfl:    phentermine  15 MG capsule, Take 1 capsule (15 mg total) by mouth every morning., Disp: 30 capsule, Rfl: 2   prazosin (MINIPRESS) 2 MG capsule, Take 2 mg by mouth at bedtime., Disp: , Rfl:    vitamin E 100 UNIT capsule, Take by mouth., Disp: , Rfl:   Current Facility-Administered Medications:    Tdap (BOOSTRIX) injection 0.5 mL, 0.5 mL, Intramuscular, Once, McVey, Almarie Folks, PA-C  "

## 2024-02-11 NOTE — Progress Notes (Signed)
 "   Assessment/Plan:   [name] is a very pleasant 59 y.o. year old RH female with a history of hypertension, hyperlipidemia, sinusitis, arthritis, fibromyalgia, depression, anxiety seen today for evaluation of memory concerns.  MoCA today is /30.***.  Patient is able to participate on ADLs***.  Patient continues to drive without significant difficulties.***    Memory Impairment of unclear etiology  MRI brain without contrast to assess for underlying structural abnormality and assess vascular load  Check B12, TSH Monitor the use of medications for chronic pain including muscle relaxers, pain meds, gabapentin   Continue to control mood as per PCP Recommend good control of cardiovascular risk factors Folllow up in  months***  Subjective:   The patient is accompanied by ***  who supplement  the history.   How long did patient have memory difficulties? For the last ***.  Reports some difficulty remembering new information, conversations and names.  Long-term memory is good. repeats oneself?  Endorsed Disoriented when walking into a room?  Patient denies except occasionally not remembering what patient came to the room for ***  Leaving objects in unusual places? Denies.   Wandering behavior?  denies .  Any personality changes?  Denies.   Any history of depression?:  Denies   Hallucinations or paranoia?  Denies   Seizures?  Denies    Any sleep changes?   Sleeps well***does not sleep well***denies vivid dreams, REM behavior or sleepwalking   Sleep apnea?  Denies   Any hygiene concerns?  Denies   Independent of bathing and dressing?  Endorsed  Does the patient needs help with medications? Patient is in charge *** Who is in charge of the finances? Patient is in charge   *** Any changes in appetite?  Denies ***   Patient have trouble swallowing? Denies.   Does the patient cook? No ***  Any kitchen accidents such as leaving the stove on? Denies.   Any history of headaches?    Denies.   Chronic pain ?  She has fibromyalgia, with chronic pains, chronic knee and hip pain, cervical pain.  Had NCS-EMG December 2024 yielding normal results.   Ambulates with difficulty?  Denies. *** Recent falls or head injuries? Denies.   Vision changes? Denies.   Any stroke like symptoms? Denies.   Any tremors?   Denies.   Any anosmia?  Denies.   Any incontinence of urine? Denies.   Any bowel dysfunction? Denies.      Patient lives with  *** History of heavy alcohol intake? Denies.   History of heavy tobacco use? Denies.   Family history of dementia? Denies.  Does patient drive? Yes ***  Pertinent available labs: ***  MRI brain, personally reviewed, remarkable for  ***  Past Medical History:  Diagnosis Date   Allergy    Anxiety 1986   Arthritis    Depression 1986   Diabetes mellitus without complication (HCC) 2024   Hypertension    Neuromuscular disorder (HCC) 1990   Osteopenia    Osteoporosis 2019   Sleep apnea      Past Surgical History:  Procedure Laterality Date   SPINAL CORD DECOMPRESSION N/A 09/16/2010   Cervical spine- Dr. Beuford   SPINAL FUSION N/A 09/16/2010   Cervical spine- Dr. Beuford     Allergies[1]  Current Outpatient Medications  Medication Instructions   aspirin EC 325 MG tablet Take by mouth.   escitalopram (LEXAPRO) 10 mg, Daily   gabapentin  (NEURONTIN ) 300 MG capsule No dose, route, or frequency  recorded.   lidocaine  (LIDODERM ) 5 % 1 patch, Transdermal, Every 24 hours, Remove & Discard patch within 12 hours or as directed by MD   loratadine (CLARITIN) 10 mg, Daily   methocarbamol  (ROBAXIN ) 500 mg, Oral, 2 times daily   naloxone (NARCAN) nasal spray 4 mg/0.1 mL Place into the nose.   Omega-3 Fatty Acids (FISH OIL) 1000 MG CAPS Take by mouth.   omeprazole (PRILOSEC) 20 MG capsule Take by mouth.   phentermine  15 mg, Oral, BH-each morning   prazosin (MINIPRESS) 2 mg, Daily at bedtime   predniSONE  (DELTASONE ) 20 mg, Oral, Daily with  breakfast   vitamin E 100 UNIT capsule Take by mouth.     VITALS:  There were no vitals filed for this visit.        No data to display              No data to display           Neurological Exam    Orientation:  Alert and oriented to person, place and time. No aphasia or dysarthria. Fund of knowledge is appropriate. Recent memory impaired and remote memory intact.  Attention and concentration are normal.  Able to name objects and repeat phrases. Delayed recall  /5 Cranial nerves: There is good facial symmetry. Extraocular muscles are intact and visual fields are full to confrontational testing. Speech is fluent and clear. No tongue deviation. Hearing is decreased to conversational tone. Tone: Tone is good throughout. Abnormal movements: No tremors. No Asterixis. No Fasciculations Sensation: Sensation is intact to light touch. Vibration is intact at the bilateral big toe.  Coordination: The patient has no difficulty with RAM's or FNF bilaterally. Normal finger to nose  Motor: Strength is 5/5 in the bilateral upper and lower extremities. There is no pronator drift. There are no fasciculations noted. DTR's: Deep tendon reflexes are 1/4 bilaterally. Gait and Station: The patient is able to ambulate without difficulty The patient is able to heel toe walk. Gait is cautious and narrow. The patient is able to ambulate in a tandem fashion.       Thank you for allowing us  the opportunity to participate in the care of this nice patient. Please do not hesitate to contact us  for any questions or concerns.   Total time spent on today's visit was *** minutes dedicated to this patient today, preparing to see patient, examining the patient, ordering tests and/or medications and counseling the patient, documenting clinical information in the EHR or other health record, independently interpreting results and communicating results to the patient/family, discussing treatment and goals, answering  patient's questions and coordinating care.  Cc:  Mercer Clotilda SAUNDERS, MD  Camie Sevin 02/11/2024 12:37 PM      [1]  Allergies Allergen Reactions   Azithromycin  Shortness Of Breath and Swelling   Claritin [Loratadine] Other (See Comments)    Discoloration of lips w/ swelling.   Penicillins Other (See Comments)    Has patient had a PCN reaction causing immediate rash, facial/tongue/throat swelling, SOB or lightheadedness with hypotension: unknown Has patient had a PCN reaction causing severe rash involving mucus membranes or skin necrosis: unknown Has patient had a PCN reaction that required hospitalization unknown Has patient had a PCN reaction occurring within the last 10 years: yes If all of the above answers are NO, then may proceed with Cephalosporin use.    Prozac  [Fluoxetine  Hcl]     Throat swelling   "

## 2024-02-14 ENCOUNTER — Encounter: Payer: Self-pay | Admitting: Physician Assistant

## 2024-02-14 ENCOUNTER — Ambulatory Visit: Payer: Self-pay

## 2024-02-14 ENCOUNTER — Ambulatory Visit: Payer: Self-pay | Admitting: Physician Assistant

## 2024-02-14 ENCOUNTER — Other Ambulatory Visit

## 2024-02-14 VITALS — BP 146/80 | HR 75 | Resp 18 | Ht 65.0 in | Wt 221.0 lb

## 2024-02-14 DIAGNOSIS — R413 Other amnesia: Secondary | ICD-10-CM

## 2024-02-14 NOTE — Patient Instructions (Signed)
 It was a pleasure to see you today at our office.   Recommendations:   MRI of the brain, the radiology office will call you to arrange you appointment   Check labs today   Follow up pending on the results Consult with your doctor about multiple meds that can contribute to memory loss   Recommend visiting the website :  Dementia [employer] [facility] to better understand some behaviors related to memory loss.  For psychiatric meds, mood meds: Please have your primary care physician manage these medications.  If you have any severe symptoms of a stroke, or other severe issues such as confusion,severe chills or fever, etc call 911 or go to the ER as you may need to be evaluated further For guidance regarding [facility] and if placement were needed at the facility, contact Social Worker tel: [phone]  For assessment of decision of mental capacity and competency:  Call Dr. Rosaline Nine, geriatric psychiatrist at [phone] Counseling regarding caregiver distress, including caregiver depression, anxiety and issues regarding community resources, adult day care programs, adult living facilities, or memory care questions:  please contact your  Primary Doctor's Social Worker   FOR Memory  decline, memory medications: Call our office [phone]    [email or web address]   RECOMMENDATIONS FOR ALL PATIENTS WITH MEMORY PROBLEMS: 1. Continue to exercise (Recommend 30 minutes of walking everyday, or 3 hours every week) 2. Increase social interactions - continue going to [place] and enjoy social gatherings with friends and family 3. Eat healthy, avoid fried foods and eat more fruits and vegetables 4. Maintain adequate blood pressure, blood sugar, and blood cholesterol level. Reducing the risk of stroke and cardiovascular disease also helps promoting better memory. 5. Avoid stressful situations. Live a simple life and avoid  aggravations. Organize your time and prepare for the next day in anticipation. 6. Sleep well, avoid any interruptions of sleep and avoid any distractions in the bedroom that may interfere with adequate sleep quality 7. Avoid sugar, avoid sweets as there is a strong link between excessive sugar intake, diabetes, and cognitive impairment We discussed the Mediterranean diet, which has been shown to help patients reduce the risk of progressive memory disorders and reduces cardiovascular risk. This includes eating fish, eat fruits and green leafy vegetables, nuts like almonds and hazelnuts, walnuts, and also use olive oil. Avoid fast foods and fried foods as much as possible. Avoid sweets and sugar as sugar use has been linked to worsening of memory function.  There is always a concern of gradual progression of memory problems. If this is the case, then we may need to adjust level of care according to patient needs. Support, both to the patient and caregiver, should then be put into place.         DRIVING: Regarding driving, in patients with progressive memory problems, driving will be impaired. We advise to have someone else do the driving if trouble finding directions or if minor accidents are reported. Independent driving assessment is available to determine safety of driving.   If you are interested in the driving assessment, you can contact the following:  [facility] in [place] [phone]  [facility] [phone]  [facility] [phone]  [facility] [phone] or [phone]   FALL PRECAUTIONS: Be cautious when walking. Scan the area for obstacles that may increase the risk of trips and falls. When getting up in the mornings, sit up at the edge of the bed for a few minutes before getting  out of bed. Consider elevating the bed at the head end to avoid drop of blood pressure when getting up. Walk always in a well-lit room (use night  lights in the walls). Avoid area rugs or power cords from appliances in the middle of the walkways. Use a walker or a cane if necessary and consider physical therapy for balance exercise. Get your eyesight checked regularly.  FINANCIAL OVERSIGHT: Supervision, especially oversight when making financial decisions or transactions is also recommended.  HOME SAFETY: Consider the safety of the kitchen when operating appliances like stoves, microwave oven, and blender. Consider having supervision and share cooking responsibilities until no longer able to participate in those. Accidents with firearms and other hazards in the house should be identified and addressed as well.   ABILITY TO BE LEFT ALONE: If patient is unable to contact 911 operator, consider using LifeLine, or when the need is there, arrange for someone to stay with patients. Smoking is a fire hazard, consider supervision or cessation. Risk of wandering should be assessed by caregiver and if detected at any point, supervision and safe proof recommendations should be instituted.  MEDICATION SUPERVISION: Inability to self-administer medication needs to be constantly addressed. Implement a mechanism to ensure safe administration of the medications.      Mediterranean Diet A Mediterranean diet refers to food and lifestyle choices that are based on the traditions of countries located on the [employer] [place]. This way of eating has been shown to help prevent certain conditions and improve outcomes for people who have chronic diseases, like kidney disease and heart disease. What are tips for following this plan? Lifestyle  Cook and eat meals together with your family, when possible. Drink enough fluid to keep your urine clear or pale yellow. Be physically active every day. This includes: Aerobic exercise like running or swimming. Leisure activities like gardening, walking, or housework. Get 7-8 hours of sleep each night. If recommended by  your health care provider, drink red wine in moderation. This means 1 glass a day for nonpregnant women and 2 glasses a day for men. A glass of wine equals 5 oz (150 mL). Reading food labels  Check the serving size of packaged foods. For foods such as rice and pasta, the serving size refers to the amount of cooked product, not dry. Check the total fat in packaged foods. Avoid foods that have saturated fat or trans fats. Check the ingredients list for added sugars, such as corn syrup. Shopping  At the grocery store, buy most of your food from the areas near the walls of the store. This includes: Fresh fruits and vegetables (produce). Grains, beans, nuts, and seeds. Some of these may be available in unpackaged forms or large amounts (in bulk). Fresh seafood. Poultry and eggs. Low-fat dairy products. Buy whole ingredients instead of prepackaged foods. Buy fresh fruits and vegetables in-season from local farmers markets. Buy frozen fruits and vegetables in resealable bags. If you do not have access to quality fresh seafood, buy precooked frozen shrimp or canned fish, such as tuna, salmon, or sardines. Buy small amounts of raw or cooked vegetables, salads, or olives from the deli or salad bar at your store. Stock your pantry so you always have certain foods on hand, such as olive oil, canned tuna, canned tomatoes, rice, pasta, and beans. Cooking  Cook foods with extra-virgin olive oil instead of using butter or other vegetable oils. Have meat as a side dish, and have vegetables or grains as your main dish. This  means having meat in small portions or adding small amounts of meat to foods like pasta or stew. Use beans or vegetables instead of meat in common dishes like chili or lasagna. Experiment with different cooking methods. Try roasting or broiling vegetables instead of steaming or sauteing them. Add frozen vegetables to soups, stews, pasta, or rice. Add nuts or seeds for added healthy fat at  each meal. You can add these to yogurt, salads, or vegetable dishes. Marinate fish or vegetables using olive oil, lemon juice, garlic, and fresh herbs. Meal planning  Plan to eat 1 vegetarian meal one day each week. Try to work up to 2 vegetarian meals, if possible. Eat seafood 2 or more times a week. Have healthy snacks readily available, such as: Vegetable sticks with hummus. Greek yogurt. Fruit and nut trail mix. Eat balanced meals throughout the week. This includes: Fruit: 2-3 servings a day Vegetables: 4-5 servings a day Low-fat dairy: 2 servings a day Fish, poultry, or lean meat: 1 serving a day Beans and legumes: 2 or more servings a week Nuts and seeds: 1-2 servings a day Whole grains: 6-8 servings a day Extra-virgin olive oil: 3-4 servings a day Limit red meat and sweets to only a few servings a month What are my food choices? Mediterranean diet Recommended Grains: Whole-grain pasta. Brown rice. Bulgar wheat. Polenta. Couscous. Whole-wheat bread. [place] Made[place]. Vegetables: Artichokes. Beets. Broccoli. Cabbage. Carrots. Eggplant. Green beans. Chard. Kale. Spinach. Onions. Leeks. Peas. Squash. Tomatoes. Peppers. Radishes. Fruits: Apples. Apricots. Avocado. Berries. Bananas. Cherries. Dates. Figs. Grapes. Lemons. Melon. Oranges. Peaches. Plums. Pomegranate. Meats and other protein foods: Beans. Almonds. Sunflower seeds. Pine nuts. Peanuts. Cod. Salmon. Scallops. Shrimp. Tuna. Tilapia. Clams. Oysters. Eggs. Dairy: Low-fat milk. Cheese. Greek yogurt. Beverages: Water. Red wine. Herbal tea. Fats and oils: Extra virgin olive oil. Avocado oil. Grape seed oil. Sweets and desserts: Greek yogurt with honey. Baked apples. Poached pears. Trail mix. Seasoning and other foods: Basil. Cilantro. Coriander. Cumin. Mint. Parsley. Sage. Rosemary. Tarragon. Garlic. Oregano. Thyme. Pepper. Balsalmic vinegar. Tahini. Hummus. Tomato sauce. Olives. Mushrooms. Limit these Grains: Prepackaged pasta  or rice dishes. Prepackaged cereal with added sugar. Vegetables: Deep fried potatoes (french fries). Fruits: Fruit canned in syrup. Meats and other protein foods: Beef. Pork. Lamb. Poultry with skin. Hot dogs. [place]. Dairy: Ice cream. Sour cream. Whole milk. Beverages: Juice. Sugar-sweetened soft drinks. Beer. Liquor and spirits. Fats and oils: Butter. Canola oil. Vegetable oil. Beef fat (tallow). Lard. Sweets and desserts: Cookies. Cakes. Pies. Candy. Seasoning and other foods: Mayonnaise. Premade sauces and marinades. The items listed may not be a complete list. Talk with your dietitian about what dietary choices are right for you. Summary The Mediterranean diet includes both food and lifestyle choices. Eat a variety of fresh fruits and vegetables, beans, nuts, seeds, and whole grains. Limit the amount of red meat and sweets that you eat. Talk with your health care provider about whether it is safe for you to drink red wine in moderation. This means 1 glass a day for nonpregnant women and 2 glasses a day for men. A glass of wine equals 5 oz (150 mL). This information is not intended to replace advice given to you by your health care provider. Make sure you discuss any questions you have with your health care provider. Document Released: 09/03/2015 Document Revised: 10/06/2015 Document Reviewed: 09/03/2015 Elsevier Interactive Patient Education  2017 [employer].

## 2024-02-15 ENCOUNTER — Ambulatory Visit: Payer: Self-pay | Admitting: Physician Assistant

## 2024-02-15 LAB — TSH: TSH: 0.75 m[IU]/L (ref 0.40–4.50)

## 2024-02-15 LAB — VITAMIN B12: Vitamin B-12: 723 pg/mL (ref 200–1100)

## 2024-03-14 ENCOUNTER — Other Ambulatory Visit
